# Patient Record
Sex: Male | Born: 1955 | Race: White | Hispanic: No | Marital: Married | Smoking: Former smoker
Health system: Southern US, Community
[De-identification: ages and names within clinical notes are randomized; demographics above are authoritative.]

## PROBLEM LIST (undated history)

## (undated) ENCOUNTER — Emergency Department (HOSPITAL_BASED_OUTPATIENT_CLINIC_OR_DEPARTMENT_OTHER): Payer: Medicaid Other | Source: Home / Self Care

## (undated) DIAGNOSIS — E7800 Pure hypercholesterolemia, unspecified: Secondary | ICD-10-CM

## (undated) DIAGNOSIS — F32A Depression, unspecified: Secondary | ICD-10-CM

## (undated) DIAGNOSIS — F329 Major depressive disorder, single episode, unspecified: Secondary | ICD-10-CM

## (undated) DIAGNOSIS — H9190 Unspecified hearing loss, unspecified ear: Secondary | ICD-10-CM

## (undated) DIAGNOSIS — M549 Dorsalgia, unspecified: Secondary | ICD-10-CM

## (undated) DIAGNOSIS — E119 Type 2 diabetes mellitus without complications: Secondary | ICD-10-CM

## (undated) DIAGNOSIS — M109 Gout, unspecified: Secondary | ICD-10-CM

## (undated) DIAGNOSIS — I739 Peripheral vascular disease, unspecified: Secondary | ICD-10-CM

## (undated) DIAGNOSIS — I10 Essential (primary) hypertension: Secondary | ICD-10-CM

## (undated) DIAGNOSIS — G4730 Sleep apnea, unspecified: Secondary | ICD-10-CM

## (undated) DIAGNOSIS — M1990 Unspecified osteoarthritis, unspecified site: Secondary | ICD-10-CM

## (undated) DIAGNOSIS — C801 Malignant (primary) neoplasm, unspecified: Secondary | ICD-10-CM

## (undated) HISTORY — PX: SKIN CANCER EXCISION: SHX779

## (undated) HISTORY — PX: ANKLE SURGERY: SHX546

---

## 2008-10-31 ENCOUNTER — Emergency Department (HOSPITAL_COMMUNITY): Admission: EM | Admit: 2008-10-31 | Discharge: 2008-10-31 | Payer: Self-pay | Admitting: Emergency Medicine

## 2008-10-31 ENCOUNTER — Encounter (INDEPENDENT_AMBULATORY_CARE_PROVIDER_SITE_OTHER): Payer: Self-pay | Admitting: Emergency Medicine

## 2008-10-31 ENCOUNTER — Ambulatory Visit: Payer: Self-pay | Admitting: Vascular Surgery

## 2008-11-09 ENCOUNTER — Ambulatory Visit: Payer: Self-pay | Admitting: Vascular Surgery

## 2008-12-02 ENCOUNTER — Ambulatory Visit (HOSPITAL_COMMUNITY): Admission: RE | Admit: 2008-12-02 | Discharge: 2008-12-02 | Payer: Self-pay | Admitting: Vascular Surgery

## 2008-12-02 ENCOUNTER — Ambulatory Visit: Payer: Self-pay | Admitting: Vascular Surgery

## 2010-11-06 LAB — POCT I-STAT, CHEM 8
BUN: 19 mg/dL (ref 6–23)
Calcium, Ion: 1.13 mmol/L (ref 1.12–1.32)
Chloride: 108 mEq/L (ref 96–112)
Creatinine, Ser: 0.8 mg/dL (ref 0.4–1.5)
Glucose, Bld: 136 mg/dL — ABNORMAL HIGH (ref 70–99)
Potassium: 3.9 mEq/L (ref 3.5–5.1)

## 2010-11-07 LAB — GLUCOSE, CAPILLARY: Glucose-Capillary: 84 mg/dL (ref 70–99)

## 2010-12-11 NOTE — Op Note (Signed)
NAME:  Darren Olson, Darren Olson                 ACCOUNT NO.:  [id number]   MEDICAL RECORD NO.:  [id number]          PATIENT TYPE:  AMB   LOCATION:  SDS                          FACILITY:  [facility]   PHYSICIAN:  Janetta Hora. Fields, MD  DATE OF BIRTH:  22-Sep-1955   DATE OF PROCEDURE:  12/02/2008  DATE OF DISCHARGE:  12/02/2008                               OPERATIVE REPORT   PROCEDURE:  Aortogram with bilateral lower extremity runoff.   PREOPERATIVE DIAGNOSIS:  Claudication right leg.   POSTOPERATIVE DIAGNOSIS:  Claudication right leg   ANESTHESIA:  Local.   OPERATIVE DETAILS:  After obtaining informed consent, the patient was  taken to the [facility] Lab.  The patient was placed in supine position on the  angio table.  Both groins were prepped and draped in the usual sterile  fashion.  Local anesthesia was infiltrated over the left common femoral  artery.  An introducer needle was used to cannulate the common femoral  artery and a 0.035 Wholey wire advanced into the abdominal aorta under  fluoroscopic guidance.  Next, a 5-French sheath was placed over the  guidewire in the left common femoral artery.  A 5-French pigtail  catheter was then placed over the guidewire in the abdominal aorta.  Abdominal aortogram was obtained.  This shows bilateral single renal  arteries which were patent.  There was mild atherosclerotic change of  the abdominal aorta but no focal stenosis.  The left and right common  iliac arteries are patent.  The left external and internal iliac  arteries are patent.  The right external and internal iliac arteries are  patent.  Oblique views were also obtained of the pelvis and again this  shows no flow-limiting stenosis in the common external or internal iliac  arteries bilaterally.  Next, lower extremity runoff views were obtained.  This shows the left and right common femoral arteries are patent  bilaterally.  The left profunda femoris and superficial femoral artery  is widely patent.   The right profunda femoris artery is patent.  There  is a 50% stenosis extending approximately 2 cm into the right  superficial femoral artery.  The right superficial femoral artery is  then patent throughout its course.  The right popliteal artery occludes  near its origin.  The left popliteal artery is patent.  In the left leg,  there is three-vessel runoff to the foot.  In the right leg, the  posterior tibial and peroneal arteries are occluded.  The anterior  tibial artery reconstitutes via geniculate and profunda collaterals.  The anterior tibial artery is then patent from the upper third of the  leg all the way into the foot.  Next, the pigtail catheter was removed  over a guidewire and 5-French sheath was left in place to pull it in the  holding area.  The patient tolerated the procedure well and there were  no complications.   OPERATIVE FINDINGS:  1. Occlusion of right popliteal artery.  2. Occlusion of right peroneal and posterior tibial arteries.  3. 50% stenosis of origin of right superficial femoral artery.  4.  One-vessel runoff to the right foot via the anterior tibial artery.  5. Patent left femoropopliteal and tibial arteries with three-vessel      runoff to the left foot.      Janetta Hora. Fields, MD  Electronically Signed     CEF/MEDQ  D:  12/02/2008  T:  12/03/2008  Job:  [id number]

## 2010-12-11 NOTE — Assessment & Plan Note (Signed)
OFFICE VISIT   Darren Olson, Darren Olson  DOB:  10/15/1955                                       11/09/2008  CHART#:[id number]   The patient is a 55 year old male with history of claudication primarily  in his right leg.  This has been occurring over the last year, becoming  progressively worse.  He now has symptoms in his right anterior calf  after approximately 30-40 yards.  His right leg has some chronic atrophy  and shortening secondary to a severe ankle fracture several years ago.  He has some mild symptoms in his left leg but this is really not  significant.  He denies rest pain.  He denies history of nonhealing  ulcerations.   His atherosclerotic risk factors include diabetes which he has had for  at least 3 years.  He also has a history of hypertension.  He is a  former smoker and quit in 2000.   PAST SURGICAL HISTORY:  He had a right ankle fusion.   PAST MEDICAL HISTORY:  Is otherwise fairly unremarkable.   MEDICATIONS:  1. Metformin 500 mg 1 tablet in the evening, 1 tablet in the morning.  2. Lisinopril 10 mg 1 in the morning.  3. Fish Oil 1000 mg 2 in the morning.  4. Acidophilus 2 tablets in the morning.  5. Cinnamon 2 tablets in the morning.  6. Vicodin p.r.n.   He has no known drug allergies.   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  Smoking history as listed above.  He does not  consume alcohol.   REVIEW OF SYSTEMS:  He is 5 feet 9 inches, 238 pounds.  ORTHOPEDIC:  He has multiple joint arthritis pain.  ENT:  He has some decline in his hearing.  CARDIAC, PULMONARY, GI, RENAL, NEUROLOGIC, PSYCHIATRIC, HEMATOLOGIC:  All negative.   PHYSICAL EXAM:  Blood pressure is 143/87 in the left arm, pulse is 76  and regular.  HEENT:  Unremarkable.  Neck:  Has 2+ carotid pulses  without bruit.  Chest:  Clear to auscultation.  Cardiac:  Regular rate  and rhythm without murmur.  Abdomen:  Obese, soft,  nontender,  nondistended, no masses.  Extremities:  He has 2+ radial pulses  bilaterally.  He has a 1+ right femoral pulse, he has a 2+ left femoral  pulse, he has absent popliteal and pedal pulses bilaterally.   He had an ABI performed of his right leg at [facility] on October 31, 2008.  This showed an ABI on the right side of 0.43.  Left ABI was within  normal limits at 1.2.   In summary, the patient has moderate to severe claudication symptoms in  his right lower extremity.  This right leg already has some disability  and this has been compounded by his claudication symptoms.  I discussed  with him today risk factor modification as far as controlling his  diabetes and hypertension.  I also started him on Pletal today 100 mg  twice a day.  He is also started on aspirin 81 mg once a day.  We will  schedule him for an aortogram, bilateral lower extremity runoff on November 25, 2008.  If he has significant iliac occlusive disease or superficial  femoral artery occlusive disease, we may consider  angioplasty and  stenting at that time.  The risks, benefits, possible complications,  procedure details were explained to the patient including, but not  limited to, renal insufficiency, renal dysfunction, bleeding, infection,  vessel injury.  He understands and agrees to proceed.   Janetta Hora. Fields, MD  Electronically Signed   CEF/MEDQ  D:  11/10/2008  T:  11/10/2008  Job:  [id number]   cc:   Dr. Aneta Mins

## 2013-09-20 ENCOUNTER — Encounter (HOSPITAL_BASED_OUTPATIENT_CLINIC_OR_DEPARTMENT_OTHER): Payer: Self-pay | Admitting: Emergency Medicine

## 2013-09-20 ENCOUNTER — Emergency Department (HOSPITAL_BASED_OUTPATIENT_CLINIC_OR_DEPARTMENT_OTHER)
Admission: EM | Admit: 2013-09-20 | Discharge: 2013-09-20 | Disposition: A | Discharge status: Home / Self Care | Payer: Medicaid Other | Attending: Emergency Medicine | Admitting: Emergency Medicine

## 2013-09-20 ENCOUNTER — Emergency Department (HOSPITAL_BASED_OUTPATIENT_CLINIC_OR_DEPARTMENT_OTHER): Payer: Medicaid Other

## 2013-09-20 DIAGNOSIS — F329 Major depressive disorder, single episode, unspecified: Secondary | ICD-10-CM | POA: Insufficient documentation

## 2013-09-20 DIAGNOSIS — I10 Essential (primary) hypertension: Secondary | ICD-10-CM | POA: Insufficient documentation

## 2013-09-20 DIAGNOSIS — F3289 Other specified depressive episodes: Secondary | ICD-10-CM | POA: Insufficient documentation

## 2013-09-20 DIAGNOSIS — M62838 Other muscle spasm: Secondary | ICD-10-CM | POA: Insufficient documentation

## 2013-09-20 DIAGNOSIS — Z79899 Other long term (current) drug therapy: Secondary | ICD-10-CM | POA: Insufficient documentation

## 2013-09-20 DIAGNOSIS — Z8669 Personal history of other diseases of the nervous system and sense organs: Secondary | ICD-10-CM | POA: Insufficient documentation

## 2013-09-20 DIAGNOSIS — Z87891 Personal history of nicotine dependence: Secondary | ICD-10-CM | POA: Insufficient documentation

## 2013-09-20 DIAGNOSIS — IMO0002 Reserved for concepts with insufficient information to code with codable children: Secondary | ICD-10-CM | POA: Insufficient documentation

## 2013-09-20 DIAGNOSIS — E119 Type 2 diabetes mellitus without complications: Secondary | ICD-10-CM | POA: Insufficient documentation

## 2013-09-20 DIAGNOSIS — E7800 Pure hypercholesterolemia, unspecified: Secondary | ICD-10-CM | POA: Insufficient documentation

## 2013-09-20 HISTORY — DX: Major depressive disorder, single episode, unspecified: F32.9

## 2013-09-20 HISTORY — DX: Sleep apnea, unspecified: G47.30

## 2013-09-20 HISTORY — DX: Dorsalgia, unspecified: M54.9

## 2013-09-20 HISTORY — DX: Pure hypercholesterolemia, unspecified: E78.00

## 2013-09-20 HISTORY — DX: Depression, unspecified: F32.A

## 2013-09-20 HISTORY — DX: Type 2 diabetes mellitus without complications: E11.9

## 2013-09-20 HISTORY — DX: Essential (primary) hypertension: I10

## 2013-09-20 HISTORY — DX: Unspecified hearing loss, unspecified ear: H91.90

## 2013-09-20 MED ORDER — METHOCARBAMOL 500 MG PO TABS: 500.0000 mg | ORAL_TABLET | Freq: Two times a day (BID) | ORAL | Status: DC

## 2013-09-20 MED ORDER — METHOCARBAMOL 500 MG PO TABS
1000.0000 mg | ORAL_TABLET | Freq: Once | ORAL | Status: AC
  Administered 2013-09-20: 1000 mg via ORAL
  Filled 2013-09-20: qty 2

## 2013-09-20 MED ORDER — NAPROXEN 250 MG PO TABS
500.0000 mg | ORAL_TABLET | Freq: Once | ORAL | Status: AC
  Administered 2013-09-20: 500 mg via ORAL
  Filled 2013-09-20: qty 2

## 2013-09-20 MED ORDER — KETOROLAC TROMETHAMINE 60 MG/2ML IM SOLN: 60.0000 mg | Freq: Once | INTRAMUSCULAR | Status: DC

## 2013-09-20 MED ORDER — TRAMADOL HCL 50 MG PO TABS: 50.0000 mg | ORAL_TABLET | Freq: Four times a day (QID) | ORAL | Status: AC | PRN

## 2013-09-20 MED ORDER — MELOXICAM 7.5 MG PO TABS: 7.5000 mg | ORAL_TABLET | Freq: Every day | ORAL | Status: DC

## 2013-09-20 NOTE — ED Provider Notes (Signed)
CSN: [id number]     Arrival date & time 09/20/13  1945 History   This chart was scribed for Tomorrow Dehaas Alfonso Patten, MD by Adriana Reams, ED Scribe. This patient was seen in room MH08/MH08 and the patient's care was started at 2300.   First MD Initiated Contact with Patient 09/20/13 2300     Chief Complaint  Patient presents with  . Shoulder Pain      Patient is a 58 y.o. male presenting with shoulder pain. The history is provided by the patient. No language interpreter was used.  Shoulder Pain This is a new problem. The current episode started more than 1 week ago. The problem occurs constantly. The problem has been gradually worsening. Pertinent negatives include no chest pain, no abdominal pain, no headaches and no shortness of breath. The symptoms are aggravated by bending and exertion. Nothing relieves the symptoms. Treatments tried: ibuprofen. The treatment provided no relief.    Past Medical History  Diagnosis Date  . Back pain   . Depression   . Hypertension   . High cholesterol   . Diabetes mellitus without complication   . Sleep apnea   . Hearing loss    Past Surgical History  Procedure Laterality Date  . Ankle surgery     No family history on file. History  Substance Use Topics  . Smoking status: Former Research scientist (life sciences)  . Smokeless tobacco: Not on file  . Alcohol Use: Yes     Comment: "a few beers here and there"    Review of Systems  Respiratory: Negative for shortness of breath.   Cardiovascular: Negative for chest pain.  Gastrointestinal: Negative for abdominal pain.  Musculoskeletal: Positive for myalgias.  Neurological: Negative for weakness, numbness and headaches.  All other systems reviewed and are negative.       Allergies  Review of patient's allergies indicates no known allergies.  Home Medications   Current Outpatient Rx  Name  Route  Sig  Dispense  Refill  . allopurinol (ZYLOPRIM) 100 MG tablet   Oral   Take 100 mg by mouth daily.          Marland Kitchen atorvastatin (LIPITOR) 40 MG tablet   Oral   Take 40 mg by mouth daily.         . DULoxetine (CYMBALTA) 30 MG capsule   Oral   Take 30 mg by mouth daily.         Marland Kitchen gabapentin (NEURONTIN) 300 MG capsule   Oral   Take 300 mg by mouth 3 (three) times daily.         . hydrochlorothiazide (HYDRODIURIL) 25 MG tablet   Oral   Take 25 mg by mouth daily.         Marland Kitchen lisinopril (PRINIVIL,ZESTRIL) 20 MG tablet   Oral   Take 20 mg by mouth daily.         . ondansetron (ZOFRAN) 4 MG tablet   Oral   Take 4 mg by mouth every 8 (eight) hours as needed for nausea or vomiting.         . traZODone (DESYREL) 50 MG tablet   Oral   Take 50 mg by mouth at bedtime.          BP 170/101  Pulse 88  Temp(Src) 98.6 F (37 C) (Oral)  Resp 20  Ht 5\' 9"  (1.753 m)  Wt 225 lb (102.059 kg)  BMI 33.21 kg/m2  SpO2 98%  Physical Exam  Nursing note and vitals reviewed. Constitutional:  He is oriented to person, place, and time. He appears well-developed and well-nourished. No distress.  HENT:  Head: Normocephalic and atraumatic.  Mouth/Throat: Oropharynx is clear and moist.  Eyes: Conjunctivae and EOM are normal. Pupils are equal, round, and reactive to light.  Neck: Normal range of motion. Neck supple. No tracheal deviation present. No thyromegaly present.  Cardiovascular: Normal rate, regular rhythm and normal heart sounds.   Pulmonary/Chest: Effort normal and breath sounds normal. No respiratory distress. He has no wheezes. He has no rales.  Abdominal: Soft. Bowel sounds are normal. He exhibits no distension. There is no tenderness. There is no rebound.  Musculoskeletal: Normal range of motion. He exhibits no edema.  Spasm of trapezius on left.  Negative Neers test on the left LUE neurovascularly intact 5/5 intact radial pulse on the left   Lymphadenopathy:    He has no cervical adenopathy.  Neurological: He is alert and oriented to person, place, and time. He has normal reflexes.   Intact DTR. Intact biceps reflex  Skin: Skin is warm and dry.  Psychiatric: He is agitated.    ED Course  Procedures (including critical care time) DIAGNOSTIC STUDIES: Oxygen Saturation is 98% on RA, normal by my interpretation.    COORDINATION OF CARE: 11:04 PM Discussed treatment plan with pt at bedside and pt agreed to plan. Discussed imaging results. Will discharge home.    Labs Review Labs Reviewed - No data to display Imaging Review Dg Shoulder Left  09/20/2013   CLINICAL DATA:  Left shoulder pain for 1 week. This radiates down the neck in arm.  EXAM: LEFT SHOULDER - 2+ VIEW  COMPARISON:  DG SHOULDER 2+V*L* dated 06/22/2013  FINDINGS: Is stable spurring of the inferior margin of the glenoid. No Hill-Sachs impaction to suggest that this is a remote bony Bankart injury. Slightly hooked acromial undersurface morphology. No fracture or dislocation. Mild irregularity of the inferior scapula on the transscapular projection is probably incidental/artifactual.  IMPRESSION: 1. Mild spurring of the inferior glenoid ram.  No acute findings.   Electronically Signed   By: Sherryl Barters M.D.   On: 09/20/2013 20:55    EKG Interpretation   None       MDM   Final diagnoses:  None    Will d/c with pain medication and muscles relaxant if symptoms persist follow up with your orthopedic surgeon for care and additional management.  Patient verbalizes understanding and agrees to follow up I personally performed the services described in this documentation, which was scribed in my presence. The recorded information has been reviewed and is accurate.      Carlisle Beers, MD 09/21/13 [phone]

## 2013-09-20 NOTE — ED Notes (Signed)
Pt c/o left shoulder pain radiating to left neck into back of head x 1 week

## 2013-09-20 NOTE — ED Notes (Signed)
Pain in left shoulder radiating down to elbow and up into neck x1 week.   No known injury.  Hx of back and neck issues.

## 2013-09-20 NOTE — Discharge Instructions (Signed)
°  Muscle Cramps and Spasms °Muscle cramps and spasms occur when a muscle or muscles tighten and you have no control over this tightening (involuntary muscle contraction). They are a common problem and can develop in any muscle. The most common place is in the calf muscles of the leg. Both muscle cramps and muscle spasms are involuntary muscle contractions, but they also have differences:  °· Muscle cramps are sporadic and painful. They may last a few seconds to a quarter of an hour. Muscle cramps are often more forceful and last longer than muscle spasms. °· Muscle spasms may or may not be painful. They may also last just a few seconds or much longer. °CAUSES  °It is uncommon for cramps or spasms to be due to a serious underlying problem. In many cases, the cause of cramps or spasms is unknown. Some common causes are:  °· Overexertion.   °· Overuse from repetitive motions (doing the same thing over and over).   °· Remaining in a certain position for a long period of time.   °· Improper preparation, form, or technique while performing a sport or activity.   °· Dehydration.   °· Injury.   °· Side effects of some medicines.   °· Abnormally low levels of the salts and ions in your blood (electrolytes), especially potassium and calcium. This could happen if you are taking water pills (diuretics) or you are pregnant.   °Some underlying medical problems can make it more likely to develop cramps or spasms. These include, but are not limited to:  °· Diabetes.   °· Parkinson disease.   °· Hormone disorders, such as thyroid problems.   °· Alcohol abuse.   °· Diseases specific to muscles, joints, and bones.   °· Blood vessel disease where not enough blood is getting to the muscles.   °HOME CARE INSTRUCTIONS  °· Stay well hydrated. Drink enough water and fluids to keep your urine clear or pale yellow. °· It may be helpful to massage, stretch, and relax the affected muscle. °· For tight or tense muscles, use a warm towel, heating  pad, or hot shower water directed to the affected area. °· If you are sore or have pain after a cramp or spasm, applying ice to the affected area may relieve discomfort. °· Put ice in a plastic bag. °· Place a towel between your skin and the bag. °· Leave the ice on for 15-20 minutes, 03-04 times a day. °· Medicines used to treat a known cause of cramps or spasms may help reduce their frequency or severity. Only take over-the-counter or prescription medicines as directed by your caregiver. °SEEK MEDICAL CARE IF:  °Your cramps or spasms get more severe, more frequent, or do not improve over time.  °MAKE SURE YOU:  °· Understand these instructions. °· Will watch your condition. °· Will get help right away if you are not doing well or get worse. °Document Released: 01/04/2002 Document Revised: 11/09/2012 Document Reviewed: 07/01/2012 °[employer]® Patient Information ©2014 [employer]. ° ° °

## 2013-09-20 NOTE — ED Notes (Signed)
Pt did not come when called for xrays

## 2015-07-31 ENCOUNTER — Emergency Department (HOSPITAL_BASED_OUTPATIENT_CLINIC_OR_DEPARTMENT_OTHER): Payer: Medicare Other

## 2015-07-31 ENCOUNTER — Emergency Department (HOSPITAL_BASED_OUTPATIENT_CLINIC_OR_DEPARTMENT_OTHER)
Admission: EM | Admit: 2015-07-31 | Discharge: 2015-07-31 | Disposition: A | Discharge status: Home / Self Care | Payer: Medicare Other | Attending: Emergency Medicine | Admitting: Emergency Medicine

## 2015-07-31 ENCOUNTER — Encounter (HOSPITAL_BASED_OUTPATIENT_CLINIC_OR_DEPARTMENT_OTHER): Payer: Self-pay | Admitting: *Deleted

## 2015-07-31 DIAGNOSIS — R1013 Epigastric pain: Secondary | ICD-10-CM | POA: Diagnosis present

## 2015-07-31 DIAGNOSIS — K5732 Diverticulitis of large intestine without perforation or abscess without bleeding: Secondary | ICD-10-CM | POA: Diagnosis not present

## 2015-07-31 DIAGNOSIS — E119 Type 2 diabetes mellitus without complications: Secondary | ICD-10-CM | POA: Diagnosis not present

## 2015-07-31 DIAGNOSIS — K5792 Diverticulitis of intestine, part unspecified, without perforation or abscess without bleeding: Secondary | ICD-10-CM

## 2015-07-31 DIAGNOSIS — H9190 Unspecified hearing loss, unspecified ear: Secondary | ICD-10-CM | POA: Diagnosis not present

## 2015-07-31 DIAGNOSIS — I10 Essential (primary) hypertension: Secondary | ICD-10-CM | POA: Insufficient documentation

## 2015-07-31 DIAGNOSIS — F329 Major depressive disorder, single episode, unspecified: Secondary | ICD-10-CM | POA: Diagnosis not present

## 2015-07-31 DIAGNOSIS — E7800 Pure hypercholesterolemia, unspecified: Secondary | ICD-10-CM | POA: Insufficient documentation

## 2015-07-31 DIAGNOSIS — Z87891 Personal history of nicotine dependence: Secondary | ICD-10-CM | POA: Insufficient documentation

## 2015-07-31 DIAGNOSIS — Z79899 Other long term (current) drug therapy: Secondary | ICD-10-CM | POA: Insufficient documentation

## 2015-07-31 LAB — COMPREHENSIVE METABOLIC PANEL
ALBUMIN: 4.1 g/dL (ref 3.5–5.0)
ALT: 19 U/L (ref 17–63)
ANION GAP: 9 (ref 5–15)
AST: 21 U/L (ref 15–41)
Alkaline Phosphatase: 68 U/L (ref 38–126)
BILIRUBIN TOTAL: 0.4 mg/dL (ref 0.3–1.2)
BUN: 19 mg/dL (ref 6–20)
CHLORIDE: 101 mmol/L (ref 101–111)
CO2: 26 mmol/L (ref 22–32)
Calcium: 9.3 mg/dL (ref 8.9–10.3)
Creatinine, Ser: 0.95 mg/dL (ref 0.61–1.24)
GFR calc Af Amer: >60 mL/min (ref >60)
GFR calc non Af Amer: >60 mL/min (ref >60)
GLUCOSE: 253 mg/dL — AB (ref 65–99)
POTASSIUM: 3.7 mmol/L (ref 3.5–5.1)
SODIUM: 136 mmol/L (ref 135–145)
TOTAL PROTEIN: 6.6 g/dL (ref 6.5–8.1)

## 2015-07-31 LAB — URINE MICROSCOPIC-ADD ON

## 2015-07-31 LAB — CBC WITH DIFFERENTIAL/PLATELET
BAND NEUTROPHILS: 1 %
Basophils Absolute: 0 10*3/uL (ref 0.0–0.1)
Basophils Relative: 0 %
EOS ABS: 0.1 10*3/uL (ref 0.0–0.7)
Eosinophils Relative: 2 %
HEMATOCRIT: 39.3 % (ref 39.0–52.0)
Hemoglobin: 13.6 g/dL (ref 13.0–17.0)
LYMPHS ABS: 1.1 10*3/uL (ref 0.7–4.0)
Lymphocytes Relative: 18 %
MCH: 30 pg (ref 26.0–34.0)
MCHC: 34.6 g/dL (ref 30.0–36.0)
MCV: 86.8 fL (ref 78.0–100.0)
MONO ABS: 0.4 10*3/uL (ref 0.1–1.0)
MONOS PCT: 7 %
NEUTROS ABS: 4.5 10*3/uL (ref 1.7–7.7)
Neutrophils Relative %: 72 %
PLATELETS: 192 10*3/uL (ref 150–400)
RBC: 4.53 MIL/uL (ref 4.22–5.81)
RDW: 12.4 % (ref 11.5–15.5)
WBC: 6.1 10*3/uL (ref 4.0–10.5)

## 2015-07-31 LAB — LIPASE, BLOOD: Lipase: 33 U/L (ref 11–51)

## 2015-07-31 LAB — URINALYSIS, ROUTINE W REFLEX MICROSCOPIC
Bilirubin Urine: NEGATIVE
Hgb urine dipstick: NEGATIVE
KETONES UR: NEGATIVE mg/dL
LEUKOCYTES UA: NEGATIVE
NITRITE: NEGATIVE
PH: 5.5 (ref 5.0–8.0)
Protein, ur: NEGATIVE mg/dL
Specific Gravity, Urine: 1.019 (ref 1.005–1.030)

## 2015-07-31 LAB — TROPONIN I: Troponin I: <0.03 ng/mL (ref <0.031)

## 2015-07-31 MED ORDER — FAMOTIDINE 20 MG PO TABS: 20.0000 mg | ORAL_TABLET | Freq: Two times a day (BID) | ORAL | Status: AC

## 2015-07-31 MED ORDER — FAMOTIDINE IN NACL 20-0.9 MG/50ML-% IV SOLN
20.0000 mg | Freq: Once | INTRAVENOUS | Status: AC
  Administered 2015-07-31: 20 mg via INTRAVENOUS
  Filled 2015-07-31: qty 50

## 2015-07-31 MED ORDER — DICYCLOMINE HCL 10 MG PO CAPS: 20.0000 mg | ORAL_CAPSULE | Freq: Four times a day (QID) | ORAL | Status: AC | PRN

## 2015-07-31 MED ORDER — CIPROFLOXACIN HCL 500 MG PO TABS: 500.0000 mg | ORAL_TABLET | Freq: Two times a day (BID) | ORAL | Status: DC

## 2015-07-31 MED ORDER — HYDROCODONE-ACETAMINOPHEN 5-325 MG PO TABS: ORAL_TABLET | ORAL | Status: DC

## 2015-07-31 MED ORDER — IOHEXOL 300 MG/ML  SOLN
25.0000 mL | Freq: Once | INTRAMUSCULAR | Status: AC | PRN
Start: 2015-07-31 — End: 2015-07-31
  Administered 2015-07-31: 25 mL via ORAL

## 2015-07-31 MED ORDER — IOHEXOL 300 MG/ML  SOLN
100.0000 mL | Freq: Once | INTRAMUSCULAR | Status: AC | PRN
  Administered 2015-07-31: 100 mL via INTRAVENOUS

## 2015-07-31 MED ORDER — METRONIDAZOLE 500 MG PO TABS: 500.0000 mg | ORAL_TABLET | Freq: Two times a day (BID) | ORAL | Status: AC

## 2015-07-31 MED ORDER — KETOROLAC TROMETHAMINE 30 MG/ML IJ SOLN
15.0000 mg | Freq: Once | INTRAMUSCULAR | Status: AC
  Administered 2015-07-31: 15 mg via INTRAVENOUS
  Filled 2015-07-31: qty 1

## 2015-07-31 MED ORDER — SODIUM CHLORIDE 0.9 % IV BOLUS (SEPSIS)
1000.0000 mL | Freq: Once | INTRAVENOUS | Status: AC
  Administered 2015-07-31: 1000 mL via INTRAVENOUS

## 2015-07-31 MED ORDER — DICYCLOMINE HCL 10 MG PO CAPS
10.0000 mg | ORAL_CAPSULE | Freq: Once | ORAL | Status: AC
  Administered 2015-07-31: 10 mg via ORAL
  Filled 2015-07-31: qty 1

## 2015-07-31 MED ORDER — METRONIDAZOLE 500 MG PO TABS: 500.0000 mg | ORAL_TABLET | Freq: Two times a day (BID) | ORAL | Status: DC

## 2015-07-31 NOTE — ED Provider Notes (Signed)
4:45 PM: Care assumed from [facility], PA-C at shift change.  4 days epigastric pain with nausea.  Denies CP or SOB.  Well appearing, abdominal exam benign.  Labs unremarkable. Has received fluids and toradol for pain. Anticipate discharge pending CT abdomen/pelvis and cardiac workup. Suspect acid reflux, d/c home with pepcid and norco.  Follow up PCP this week.    Troponin <0.03.  EKG NSR.  CT abdomen/pelvis remarkable for acute diverticulitis of proximal sigmoid colon not associated with perforation or abscess.  Normal appendix.  Atherosclerosis of the abdominal aorta.  Degenerative disc disease.  VSS.  Patient appears non-toxic or ill-appearing.  Labs reassuring.  Will d/c home with cipro and flagyl as well.  Dicussed strict return precautions. Patient agrees and acknowledges the above plan for discharge.      Gloriann Loan, PA-C 07/31/15 [place], MD 08/01/15 2110

## 2015-07-31 NOTE — ED Provider Notes (Signed)
CSN: [id number]     Arrival date & time 07/31/15  1327 History   First MD Initiated Contact with Patient 07/31/15 1510     Chief Complaint  Patient presents with  . Abdominal Pain     (Consider location/radiation/quality/duration/timing/severity/associated sxs/prior Treatment) HPI   Blood pressure 124/76, pulse 70, temperature 98.3 F (36.8 C), temperature source Oral, resp. rate 18, height 5' 8.5" (1.74 m), weight 99.791 kg, SpO2 99 %.  Darren Olson is a 60 y.o. male non-insulin-dependent diabetic hypertension and high cholesterol complaining of 4 days of worsening epigastric abdominal pain, patient states he's nauseous but has not had vomiting, states he has to strain to have a bowel movement, he is taking a stool softener, this is not atypical for him to be constipated. He denies fever, chills, melena, hematochezia. Patient is very concerned he has a hernia, states his father died from an incarcerated hernia, father-in-law recently passed away and his 18 year old granddaughter was just diagnosed with ovarian cancer, she recently had surgery and is undergoing chemotherapy.  Past Medical History  Diagnosis Date  . Back pain   . Depression   . Hypertension   . High cholesterol   . Diabetes mellitus without complication ([place])   . Sleep apnea   . Hearing loss    Past Surgical History  Procedure Laterality Date  . Ankle surgery     No family history on file. Social History  Substance Use Topics  . Smoking status: Former Research scientist (life sciences)  . Smokeless tobacco: None  . Alcohol Use: Yes     Comment: "a few beers here and there"    Review of Systems    Allergies  Review of patient's allergies indicates no known allergies.  Home Medications   Prior to Admission medications   Medication Sig Start Date End Date Taking? Authorizing Provider  metFORMIN (GLUCOPHAGE) 1000 MG tablet Take 1,000 mg by mouth daily with breakfast.   Yes Historical Provider, MD  allopurinol (ZYLOPRIM) 100 MG  tablet Take 100 mg by mouth daily.    Historical Provider, MD  atorvastatin (LIPITOR) 40 MG tablet Take 40 mg by mouth daily.    Historical Provider, MD  dicyclomine (BENTYL) 10 MG capsule Take 2 capsules (20 mg total) by mouth 4 (four) times daily as needed for spasms. 07/31/15   Navika Hoopes, PA-C  DULoxetine (CYMBALTA) 30 MG capsule Take 30 mg by mouth daily.    Historical Provider, MD  famotidine (PEPCID) 20 MG tablet Take 1 tablet (20 mg total) by mouth 2 (two) times daily. 07/31/15   Zhi Geier, PA-C  gabapentin (NEURONTIN) 300 MG capsule Take 300 mg by mouth 3 (three) times daily.    Historical Provider, MD  hydrochlorothiazide (HYDRODIURIL) 25 MG tablet Take 25 mg by mouth daily.    Historical Provider, MD  HYDROcodone-acetaminophen (NORCO/VICODIN) 5-325 MG tablet Take 1-2 tablets by mouth every 6 hours as needed for pain and/or cough. 07/31/15   Sharnette Kitamura, PA-C  lisinopril (PRINIVIL,ZESTRIL) 20 MG tablet Take 20 mg by mouth daily.    Historical Provider, MD  meloxicam (MOBIC) 7.5 MG tablet Take 1 tablet (7.5 mg total) by mouth daily. 09/20/13   April Palumbo, MD  methocarbamol (ROBAXIN) 500 MG tablet Take 1 tablet (500 mg total) by mouth 2 (two) times daily. 09/20/13   April Palumbo, MD  ondansetron (ZOFRAN) 4 MG tablet Take 4 mg by mouth every 8 (eight) hours as needed for nausea or vomiting.    Historical Provider, MD  traMADol Veatrice Bourbon)  50 MG tablet Take 1 tablet (50 mg total) by mouth every 6 (six) hours as needed. 09/20/13   April Palumbo, MD  traZODone (DESYREL) 50 MG tablet Take 50 mg by mouth at bedtime.    Historical Provider, MD   BP 121/76 mmHg  Pulse 65  Temp(Src) 98.3 F (36.8 C) (Oral)  Resp 18  Ht 5' 8.5" (1.74 m)  Wt 99.791 kg  BMI 32.96 kg/m2  SpO2 96% Physical Exam  Constitutional: He is oriented to person, place, and time. He appears well-developed and well-nourished. No distress.  HENT:  Head: Normocephalic and atraumatic.  Mouth/Throat: Oropharynx is  clear and moist.  Eyes: Conjunctivae and EOM are normal. Pupils are equal, round, and reactive to light.  Neck: Normal range of motion.  Cardiovascular: Normal rate, regular rhythm and intact distal pulses.   Pulmonary/Chest: Effort normal and breath sounds normal. No stridor.  Abdominal: Soft. There is no tenderness.  Mild, diffuse tenderness to palpation along the bilateral upper quadrants and epigastrium, with no guarding or rebound.  Murphy sign negative, no tenderness to palpation over McBurney's point, Rovsings, Psoas and obturator all negative.   Musculoskeletal: Normal range of motion.  Neurological: He is alert and oriented to person, place, and time.  Skin: He is not diaphoretic.  Psychiatric: He has a normal mood and affect.  Nursing note and vitals reviewed.   ED Course  Procedures (including critical care time) Labs Review Labs Reviewed  COMPREHENSIVE METABOLIC PANEL - Abnormal; Notable for the following:    Glucose, Bld 253 (*)    All other components within normal limits  URINALYSIS, ROUTINE W REFLEX MICROSCOPIC (NOT AT [facility]) - Abnormal; Notable for the following:    Glucose, UA >1000 (*)    All other components within normal limits  URINE MICROSCOPIC-ADD ON - Abnormal; Notable for the following:    Squamous Epithelial / LPF 0-5 (*)    Bacteria, UA RARE (*)    All other components within normal limits  CBC WITH DIFFERENTIAL/PLATELET  LIPASE, BLOOD  TROPONIN I    Imaging Review Dg Abd Acute W/chest  07/31/2015  CLINICAL DATA:  Abdominal pain for 4 days. EXAM: DG ABDOMEN ACUTE W/ 1V CHEST COMPARISON:  09/27/2014 FINDINGS: The lungs are clear wiithout focal pneumonia, edema, pneumothorax or pleural effusion. The cardiopericardial silhouette is within normal limits for size. The visualized bony structures of the thorax are intact. Upright film shows no evidence for intraperitoneal free air. There is no evidence for gaseous bowel dilation to suggest obstruction.  Atherosclerotic calcification noted in the wall of the abdominal aorta and common iliac arteries. Visualized bony anatomy shows no worrisome lytic or sclerotic osseous abnormality. IMPRESSION: 1. No acute cardiopulmonary findings. 2. No evidence for bowel obstruction or perforation. Electronically Signed   By: Misty Stanley M.D.   On: 07/31/2015 14:48   I have personally reviewed and evaluated these images and lab results as part of my medical decision-making.   EKG Interpretation None      MDM   Final diagnoses:  Epigastric pain    Filed Vitals:   07/31/15 1334 07/31/15 1550  BP: 124/76 121/76  Pulse: 70 65  Temp: 98.3 F (36.8 C)   TempSrc: Oral   Resp: 18 18  Height: 5' 8.5" (1.74 m)   Weight: 99.791 kg   SpO2: 99% 96%    Medications  famotidine (PEPCID) IVPB 20 mg premix (not administered)  dicyclomine (BENTYL) capsule 10 mg (not administered)  sodium chloride 0.9 % bolus  1,000 mL (1,000 mLs Intravenous New Bag/Given 07/31/15 1556)  ketorolac (TORADOL) 30 MG/ML injection 15 mg (15 mg Intravenous Given 07/31/15 1624)    SAIVION KUO is 60 y.o. male presenting with epigastric abdominal pain with nausea and constipation. Abdominal exam is benign, patient is afebrile and well-appearing. I've advised patient that I do not think he has a hernia, I don't think CAT scan would be beneficial at this moment, I've advised him that if he would like to obtain a CAT scan to absolutely rule out pathology we can do that. Consider patient's history of father passing away from hernia and recent family traumas of loss of father in law and the cancer of his granddaughter will obtain CT at patient's request. Patient is driving home so pain medication choices in the ED are limited.  Blood work resulted and is reassuring, UA is also without abnormality. Repeat abdominal exam remains benign. Case signed out to PA Rose at shift change: Plan is to follow-up CT scan, EKG, chest x-ray and  troponin.  Evaluation does not show pathology that would require ongoing emergent intervention or inpatient treatment. Pt is hemodynamically stable and mentating appropriately. Discussed findings and plan with patient/guardian, who agrees with care plan. All questions answered. Return precautions discussed and outpatient follow up given.   New Prescriptions   DICYCLOMINE (BENTYL) 10 MG CAPSULE    Take 2 capsules (20 mg total) by mouth 4 (four) times daily as needed for spasms.   FAMOTIDINE (PEPCID) 20 MG TABLET    Take 1 tablet (20 mg total) by mouth 2 (two) times daily.   HYDROCODONE-ACETAMINOPHEN (NORCO/VICODIN) 5-325 MG TABLET    Take 1-2 tablets by mouth every 6 hours as needed for pain and/or cough.         Monico Blitz, PA-C 07/31/15 1657  Pattricia Boss, MD 07/31/15 2303

## 2015-07-31 NOTE — ED Notes (Signed)
Abdominal pain x 4 days. States he has to strain to have a BM and thinks he may have a hernia.

## 2015-07-31 NOTE — Discharge Instructions (Signed)
Take vicodin for breakthrough pain, do not drink alcohol, drive, care for children or do other critical tasks while taking vicodin.  Return to the emergency room for severely worsening abdominal pain, abdominal pain that localizes to a particular area (especially the right lower part of the belly), pain that persists past 8-10 hours, blood in stool or vomit, severe weakness, fainting, or fever.   Maintain hydration by drinking small amounts of clear fluids frequently, then soft diet, and then advance to a solid diet as tolerated. Avoid foods that are spicy, high in fat or dairy.  Please follow with your primary care doctor in the next 2 days for a check-up. They must obtain records for further management.   Do not hesitate to return to the Emergency Department for any new, worsening or concerning symptoms.     Diverticulitis Diverticulitis is when small pockets that have formed in your colon (large intestine) become infected or swollen. HOME CARE  Follow your doctor's instructions.  Follow a special diet if told by your doctor.  When you feel better, your doctor may tell you to change your diet. You may be told to eat a lot of fiber. Fruits and vegetables are good sources of fiber. Fiber makes it easier to poop (have bowel movements).  Take supplements or probiotics as told by your doctor.  Only take medicines as told by your doctor.  Keep all follow-up visits with your doctor. GET HELP IF:  Your pain does not get better.  You have a hard time eating food.  You are not pooping like normal. GET HELP RIGHT AWAY IF:  Your pain gets worse.  Your problems do not get better.  Your problems suddenly get worse.  You have a fever.  You keep throwing up (vomiting).  You have bloody or black, tarry poop (stool). MAKE SURE YOU:   Understand these instructions.  Will watch your condition.  Will get help right away if you are not doing well or get worse.   This information is  not intended to replace advice given to you by your health care provider. Make sure you discuss any questions you have with your health care provider.   Document Released: 01/01/2008 Document Revised: 07/20/2013 Document Reviewed: 06/09/2013 Elsevier Interactive Patient Education [employer].

## 2016-10-01 ENCOUNTER — Encounter (HOSPITAL_BASED_OUTPATIENT_CLINIC_OR_DEPARTMENT_OTHER): Payer: Self-pay | Admitting: Emergency Medicine

## 2016-10-01 ENCOUNTER — Emergency Department (HOSPITAL_BASED_OUTPATIENT_CLINIC_OR_DEPARTMENT_OTHER)
Admission: EM | Admit: 2016-10-01 | Discharge: 2016-10-01 | Disposition: A | Discharge status: Home / Self Care | Payer: Medicare Other | Attending: Emergency Medicine | Admitting: Emergency Medicine

## 2016-10-01 DIAGNOSIS — E119 Type 2 diabetes mellitus without complications: Secondary | ICD-10-CM | POA: Insufficient documentation

## 2016-10-01 DIAGNOSIS — Z79899 Other long term (current) drug therapy: Secondary | ICD-10-CM | POA: Diagnosis not present

## 2016-10-01 DIAGNOSIS — I10 Essential (primary) hypertension: Secondary | ICD-10-CM | POA: Diagnosis not present

## 2016-10-01 DIAGNOSIS — J029 Acute pharyngitis, unspecified: Secondary | ICD-10-CM | POA: Diagnosis not present

## 2016-10-01 DIAGNOSIS — Z7984 Long term (current) use of oral hypoglycemic drugs: Secondary | ICD-10-CM | POA: Diagnosis not present

## 2016-10-01 DIAGNOSIS — Z87891 Personal history of nicotine dependence: Secondary | ICD-10-CM | POA: Diagnosis not present

## 2016-10-01 LAB — RAPID STREP SCREEN (MED CTR MEBANE ONLY): Streptococcus, Group A Screen (Direct): NEGATIVE

## 2016-10-01 NOTE — Discharge Instructions (Signed)
Treatment: Take ibuprofen or Aleve as prescribed over-the-counter as needed for your throat pain. You can also alternate with Tylenol as prescribed over-the-counter. Use warm saltwater gargles or throat lozenges to help with your symptoms. You can also use over-the-counter Chloraseptic spray as prescribed.  Follow-up: Please follow-up in the primary care provider if your symptoms are not improving over the next week. Please return to emergency department if you develop any new or worsening symptoms including difficulty breathing, development of masses in your neck, or any other concerning symptoms.

## 2016-10-01 NOTE — ED Provider Notes (Signed)
[facility] Provider Note   CSN: [id number] Arrival date & time: 10/01/16  0955     History   Chief Complaint Chief Complaint  Patient presents with  . Sore Throat    HPI Darren Olson is a 61 y.o. male with history of diabetes, sleep apnea who presents with a 2 day history of sore throat. Patient states his pain is worse with swallowing. He is able to eat and drink. He denies any drooling. He denies any fevers, nasal congestion, ear pain. Patient reports very intermittent cough, but no significant coughing. No shortness of breath or chest pain, reflux, abdominal pain, nausea, vomiting. Patient has used Sudafed, throat lozenges with honey, warm salt water gargles without significant relief. No known sick contacts.  HPI  Past Medical History:  Diagnosis Date  . Back pain   . Depression   . Diabetes mellitus without complication ([place])   . Hearing loss   . High cholesterol   . Hypertension   . Sleep apnea     There are no active problems to display for this patient.   Past Surgical History:  Procedure Laterality Date  . ANKLE SURGERY         Home Medications    Prior to Admission medications   Medication Sig Start Date End Date Taking? Authorizing Provider  COLCHICINE PO Take by mouth as needed.   Yes Historical Provider, MD  glipiZIDE (GLUCOTROL) 5 MG tablet Take 5 mg by mouth daily before breakfast.   Yes Historical Provider, MD  Multiple Vitamin (MULTIVITAMIN) tablet Take 1 tablet by mouth daily.   Yes Historical Provider, MD  Omega-3 Fatty Acids (FISH OIL PO) Take by mouth.   Yes Historical Provider, MD  allopurinol (ZYLOPRIM) 100 MG tablet Take 100 mg by mouth daily.    Historical Provider, MD  atorvastatin (LIPITOR) 40 MG tablet Take 40 mg by mouth daily.    Historical Provider, MD  ciprofloxacin (CIPRO) 500 MG tablet Take 1 tablet (500 mg total) by mouth 2 (two) times daily. 07/31/15   Gloriann Loan, PA-C  dicyclomine (BENTYL) 10 MG capsule Take 2  capsules (20 mg total) by mouth 4 (four) times daily as needed for spasms. 07/31/15   Nicole Pisciotta, PA-C  DULoxetine (CYMBALTA) 30 MG capsule Take 30 mg by mouth daily.    Historical Provider, MD  famotidine (PEPCID) 20 MG tablet Take 1 tablet (20 mg total) by mouth 2 (two) times daily. 07/31/15   Nicole Pisciotta, PA-C  gabapentin (NEURONTIN) 300 MG capsule Take 300 mg by mouth 3 (three) times daily.    Historical Provider, MD  hydrochlorothiazide (HYDRODIURIL) 25 MG tablet Take 25 mg by mouth daily.    Historical Provider, MD  HYDROcodone-acetaminophen (NORCO/VICODIN) 5-325 MG tablet Take 1-2 tablets by mouth every 6 hours as needed for pain and/or cough. 07/31/15   Nicole Pisciotta, PA-C  lisinopril (PRINIVIL,ZESTRIL) 20 MG tablet Take 20 mg by mouth daily.    Historical Provider, MD  meloxicam (MOBIC) 7.5 MG tablet Take 1 tablet (7.5 mg total) by mouth daily. 09/20/13   April Palumbo, MD  metFORMIN (GLUCOPHAGE) 1000 MG tablet Take 500 mg by mouth 2 (two) times daily.     Historical Provider, MD  methocarbamol (ROBAXIN) 500 MG tablet Take 1 tablet (500 mg total) by mouth 2 (two) times daily. 09/20/13   April Palumbo, MD  metroNIDAZOLE (FLAGYL) 500 MG tablet Take 1 tablet (500 mg total) by mouth 2 (two) times daily. 07/31/15   Gloriann Loan,  PA-C  ondansetron (ZOFRAN) 4 MG tablet Take 4 mg by mouth every 8 (eight) hours as needed for nausea or vomiting.    Historical Provider, MD  traMADol (ULTRAM) 50 MG tablet Take 1 tablet (50 mg total) by mouth every 6 (six) hours as needed. 09/20/13   April Palumbo, MD  traZODone (DESYREL) 50 MG tablet Take 50 mg by mouth at bedtime.    Historical Provider, MD    Family History History reviewed. No pertinent family history.  Social History Social History  Substance Use Topics  . Smoking status: Former Research scientist (life sciences)  . Smokeless tobacco: Never Used  . Alcohol use Yes     Comment: "a few beers here and there"     Allergies   Patient has no known  allergies.   Review of Systems Review of Systems  Constitutional: Negative for chills and fever.  HENT: Positive for sore throat. Negative for ear pain and facial swelling.   Respiratory: Positive for cough (very intermittent). Negative for shortness of breath.   Cardiovascular: Negative for chest pain.  Gastrointestinal: Negative for abdominal pain, nausea and vomiting.  Genitourinary: Negative for dysuria.  Musculoskeletal: Negative for back pain.  Skin: Negative for rash and wound.  Neurological: Negative for headaches.  Psychiatric/Behavioral: The patient is not nervous/anxious.      Physical Exam Updated Vital Signs BP 143/82 (BP Location: Right Arm)   Pulse 74   Temp 98.3 F (36.8 C) (Oral)   Resp 22   Ht 5\' 7"  (1.702 m)   Wt 98.9 kg   SpO2 98%   BMI 34.14 kg/m   Physical Exam  Constitutional: He appears well-developed and well-nourished. No distress.  HENT:  Head: Normocephalic and atraumatic.  Mouth/Throat: No trismus in the jaw. Posterior oropharyngeal erythema present. No oropharyngeal exudate, posterior oropharyngeal edema or tonsillar abscesses.  Eyes: Conjunctivae are normal. Pupils are equal, round, and reactive to light. Right eye exhibits no discharge. Left eye exhibits no discharge. No scleral icterus.  Neck: Normal range of motion. Neck supple. No thyromegaly present.  Anterior cervical tenderness; no palpable lymph nodes  Cardiovascular: Normal rate, regular rhythm, normal heart sounds and intact distal pulses.  Exam reveals no gallop and no friction rub.   No murmur heard. Pulmonary/Chest: Effort normal and breath sounds normal. No stridor. No respiratory distress. He has no wheezes. He has no rales.  Abdominal: Soft. Bowel sounds are normal. He exhibits no distension. There is no tenderness. There is no rebound and no guarding.  Musculoskeletal: He exhibits no edema.  Lymphadenopathy:    He has no cervical adenopathy.  Neurological: He is alert.  Coordination normal.  Skin: Skin is warm and dry. No rash noted. He is not diaphoretic. No pallor.  Psychiatric: He has a normal mood and affect.  Nursing note and vitals reviewed.    ED Treatments / Results  Labs (all labs ordered are listed, but only abnormal results are displayed) Labs Reviewed  RAPID STREP SCREEN (NOT AT [facility])  CULTURE, GROUP A STREP [facility])    EKG  EKG Interpretation None       Radiology No results found.  Procedures Procedures (including critical care time)  Medications Ordered in ED Medications - No data to display   Initial Impression / Assessment and Plan / ED Course  I have reviewed the triage vital signs and the nursing notes.  Pertinent labs & imaging results that were available during my care of the patient were reviewed by me and considered in my  medical decision making (see chart for details).     Pt with negative strep. Diagnosis of viral pharyngitis. No abx indicated at this time. Discussed that results of strep culture are pending and patient will be informed if positive result and abx will be called in at that time. Discharge with symptomatic tx. No evidence of dehydration. Pt is tolerating secretions. Presentation not concerning for peritonsillar abscess or spread of infection to deep spaces of the throat; patent airway. Specific return precautions discussed. Recommended PCP follow up. Pt appears safe for discharge. Patient also evaluated by Dr. Canary Brim who guided the patient's management and agrees with plan.   Final Clinical Impressions(s) / ED Diagnoses   Final diagnoses:  Viral pharyngitis    New Prescriptions Discharge Medication List as of 10/01/2016 11:23 AM       Frederica Kuster, PA-C 10/01/16 [place], MD 10/01/16 1155

## 2016-10-01 NOTE — ED Triage Notes (Signed)
Pt c/o sore throat x 2d; painful to swallow

## 2016-10-03 LAB — CULTURE, GROUP A STREP (THRC)

## 2017-06-27 ENCOUNTER — Emergency Department (HOSPITAL_BASED_OUTPATIENT_CLINIC_OR_DEPARTMENT_OTHER)
Admission: EM | Admit: 2017-06-27 | Discharge: 2017-06-27 | Disposition: A | Discharge status: Home / Self Care | Payer: Medicare Other | Attending: Emergency Medicine | Admitting: Emergency Medicine

## 2017-06-27 ENCOUNTER — Encounter (HOSPITAL_BASED_OUTPATIENT_CLINIC_OR_DEPARTMENT_OTHER): Payer: Self-pay | Admitting: Emergency Medicine

## 2017-06-27 ENCOUNTER — Emergency Department (HOSPITAL_BASED_OUTPATIENT_CLINIC_OR_DEPARTMENT_OTHER): Payer: Medicare Other

## 2017-06-27 DIAGNOSIS — I10 Essential (primary) hypertension: Secondary | ICD-10-CM | POA: Diagnosis not present

## 2017-06-27 DIAGNOSIS — Z7984 Long term (current) use of oral hypoglycemic drugs: Secondary | ICD-10-CM | POA: Diagnosis not present

## 2017-06-27 DIAGNOSIS — Z79899 Other long term (current) drug therapy: Secondary | ICD-10-CM | POA: Insufficient documentation

## 2017-06-27 DIAGNOSIS — Z87891 Personal history of nicotine dependence: Secondary | ICD-10-CM | POA: Insufficient documentation

## 2017-06-27 DIAGNOSIS — E119 Type 2 diabetes mellitus without complications: Secondary | ICD-10-CM | POA: Insufficient documentation

## 2017-06-27 DIAGNOSIS — M25511 Pain in right shoulder: Secondary | ICD-10-CM | POA: Insufficient documentation

## 2017-06-27 HISTORY — DX: Unspecified osteoarthritis, unspecified site: M19.90

## 2017-06-27 HISTORY — DX: Peripheral vascular disease, unspecified: I73.9

## 2017-06-27 MED ORDER — HYDROCODONE-ACETAMINOPHEN 5-325 MG PO TABS: 1.0000 | ORAL_TABLET | Freq: Four times a day (QID) | ORAL | 0 refills | Status: AC | PRN

## 2017-06-27 MED FILL — HYDROCODON-APAP 5-325: 5-325 | 2 days supply | Qty: 10 | Fill #0

## 2017-06-27 NOTE — ED Notes (Signed)
Pt concerned about wait time. Ambulated to Restroom.

## 2017-06-27 NOTE — ED Notes (Signed)
Pt refused sling. C/o increased pain in the position required for sling application. PA informed.

## 2017-06-27 NOTE — ED Notes (Signed)
ED Provider at bedside. 

## 2017-06-27 NOTE — ED Triage Notes (Signed)
Right shoulder pain over one week,   Pt states it has gotten worse lately.  Pain increases with certain movements.  No known injuries.

## 2017-06-27 NOTE — ED Provider Notes (Signed)
[facility] EMERGENCY DEPARTMENT Provider Note   CSN: [id number] Arrival date & time: 06/27/17  0800     History   Chief Complaint Chief Complaint  Patient presents with  . Shoulder Pain    HPI Darren Olson is a 61 y.o. male with history of diabetes, hypertension, depression, back pain, sleep apnea who presents with a one-week history of worsening right shoulder pain.  It is worse with certain movements.  He describes it as a throbbing pain.  He has been taking 4 ibuprofen every 6 hours without any relief.  Patient reports he has history of pinched nerves in his neck, however this feels a little different.  He also reports being bit by a bug on his right upper arm 3 months ago, however this has progressively improved after antibiotic treatment.  He denies any chest pain, shortness of breath, fever, numbness or tingling.  HPI  Past Medical History:  Diagnosis Date  . Arthritis   . Back pain   . Depression   . Diabetes mellitus without complication ([place])   . Hearing loss   . High cholesterol   . Hypertension   . PVD (peripheral vascular disease) ([place])   . Sleep apnea     There are no active problems to display for this patient.   Past Surgical History:  Procedure Laterality Date  . ANKLE SURGERY         Home Medications    Prior to Admission medications   Medication Sig Start Date End Date Taking? Authorizing Provider  allopurinol (ZYLOPRIM) 100 MG tablet Take 100 mg by mouth daily.    [provider]  atorvastatin (LIPITOR) 40 MG tablet Take 40 mg by mouth daily.    [provider]  COLCHICINE PO Take by mouth as needed.    [provider]  dicyclomine (BENTYL) 10 MG capsule Take 2 capsules (20 mg total) by mouth 4 (four) times daily as needed for spasms. 07/31/15   Pisciotta, Elmyra Ricks, PA-C  DULoxetine (CYMBALTA) 30 MG capsule Take 30 mg by mouth daily.    [provider]  famotidine (PEPCID) 20 MG tablet Take 1 tablet  (20 mg total) by mouth 2 (two) times daily. 07/31/15   Pisciotta, Elmyra Ricks, PA-C  gabapentin (NEURONTIN) 300 MG capsule Take 300 mg by mouth 3 (three) times daily.    [provider]  glipiZIDE (GLUCOTROL) 5 MG tablet Take 5 mg by mouth daily before breakfast.    [provider]  hydrochlorothiazide (HYDRODIURIL) 25 MG tablet Take 25 mg by mouth daily.    [provider]  HYDROcodone-acetaminophen (NORCO/VICODIN) 5-325 MG tablet Take 1-2 tablets by mouth every 6 (six) hours as needed. 06/27/17   Lisaanne Lawrie, Bea Graff, PA-C  lisinopril (PRINIVIL,ZESTRIL) 20 MG tablet Take 20 mg by mouth daily.    [provider]  meloxicam (MOBIC) 7.5 MG tablet Take 1 tablet (7.5 mg total) by mouth daily. 09/20/13   Palumbo, April, MD  metFORMIN (GLUCOPHAGE) 1000 MG tablet Take 500 mg by mouth 2 (two) times daily.     [provider]  methocarbamol (ROBAXIN) 500 MG tablet Take 1 tablet (500 mg total) by mouth 2 (two) times daily. 09/20/13   Palumbo, April, MD  metroNIDAZOLE (FLAGYL) 500 MG tablet Take 1 tablet (500 mg total) by mouth 2 (two) times daily. 07/31/15   Gloriann Loan, PA-C  Multiple Vitamin (MULTIVITAMIN) tablet Take 1 tablet by mouth daily.    [provider]  Omega-3 Fatty Acids ([place]  OIL PO) Take by mouth.    [provider]  ondansetron (ZOFRAN) 4 MG tablet Take 4 mg by mouth every 8 (eight) hours as needed for nausea or vomiting.    [provider]  traMADol (ULTRAM) 50 MG tablet Take 1 tablet (50 mg total) by mouth every 6 (six) hours as needed. 09/20/13   Palumbo, April, MD  traZODone (DESYREL) 50 MG tablet Take 50 mg by mouth at bedtime.    [provider]    Family History No family history on file.  Social History Social History   Tobacco Use  . Smoking status: Former Research scientist (life sciences)  . Smokeless tobacco: Never Used  Substance Use Topics  . Alcohol use: Yes    Comment: "a few beers here and there"  . Drug use: No      Allergies   Patient has no known allergies.   Review of Systems Review of Systems  Constitutional: Negative for chills and fever.  HENT: Negative for facial swelling and sore throat.   Respiratory: Negative for shortness of breath.   Cardiovascular: Negative for chest pain.  Gastrointestinal: Negative for abdominal pain, nausea and vomiting.  Musculoskeletal: Positive for arthralgias (R shoulder), back pain (chronic) and neck pain (R).  Skin: Negative for rash and wound.  Neurological: Negative for numbness.  Psychiatric/Behavioral: The patient is not nervous/anxious.      Physical Exam Updated Vital Signs BP 119/66 (BP Location: Left Arm)   Pulse 60   Temp 98.4 F (36.9 C) (Oral)   Resp 18   Ht 5\' 8"  (1.727 m)   Wt 98.9 kg (218 lb)   SpO2 100%   BMI 33.15 kg/m   Physical Exam  Constitutional: He appears well-developed and well-nourished. No distress.  HENT:  Head: Normocephalic and atraumatic.  Mouth/Throat: Oropharynx is clear and moist. No oropharyngeal exudate.  Eyes: Conjunctivae are normal. Pupils are equal, round, and reactive to light. Right eye exhibits no discharge. Left eye exhibits no discharge. No scleral icterus.  Neck: Normal range of motion. Neck supple. No thyromegaly present.  Cardiovascular: Normal rate, regular rhythm, normal heart sounds and intact distal pulses. Exam reveals no gallop and no friction rub.  No murmur heard. Pulmonary/Chest: Effort normal and breath sounds normal. No stridor. No respiratory distress. He has no wheezes. He has no rales.  Abdominal: Soft. Bowel sounds are normal. He exhibits no distension. There is no tenderness. There is no rebound and no guarding.  Musculoskeletal: He exhibits no edema.       Arms: Tenderness to the anterior shoulder and over the deltoid, minimal pain with passive range of motion; 5/5 strength neck pain; radial pulses intact; sensation intact; positive empty can test  Lymphadenopathy:    He  has no cervical adenopathy.  Neurological: He is alert. Coordination normal.  Skin: Skin is warm and dry. No rash noted. He is not diaphoretic. No pallor.  Psychiatric: He has a normal mood and affect.  Nursing note and vitals reviewed.    ED Treatments / Results  Labs (all labs ordered are listed, but only abnormal results are displayed) Labs Reviewed - No data to display  EKG  EKG Interpretation None       Radiology Dg Shoulder Right  Result Date: 06/27/2017 CLINICAL DATA:  Worsening right shoulder pain over the last week. EXAM: RIGHT SHOULDER - 2+ VIEW COMPARISON:  None. 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 Cannot rule out small loose bodies in the inferior recess. Calcific density projected between the humeral  head and the acromion could be a sharp acromial spur, articular or bursal loose body, or tendon calcification. AC joint appears normal. No acute finding. IMPRESSION: Glenohumeral degenerative changes. Abnormal calcification in the subacromial region could be a spur, bursal calcification or loose body, or tendon calcification. Electronically Signed   By: Nelson Chimes M.D.   On: 06/27/2017 09:53    Procedures Procedures (including critical care time)  Medications Ordered in ED Medications - No data to display   Initial Impression / Assessment and Plan / ED Course  I have reviewed the triage vital signs and the nursing notes.  Pertinent labs & imaging results that were available during my care of the patient were reviewed by me and considered in my medical decision making (see chart for details).     Patient with right shoulder pain.  X-ray shows glenohumeral degenerative changes as well as abnormal calcification in the subacromial region that could be a spur, bursal calcification or loose body, or tendon calcification.  Will discharge home with supportive treatment including range of motion exercises and heat/ice.  Will discharge home with short  prescription of Norco considering no improvement with ibuprofen over max dose.  I reviewed the  narcotic database and found no discrepancies.  Patient given follow-up to orthopedics.  Return precautions discussed.  Patient understands and agrees with plan.  Patient vitals stable throughout ED course and discharged in satisfactory condition. I discussed patient case with Dr. Ashok Cordia who agrees with plan.   Final Clinical Impressions(s) / ED Diagnoses   Final diagnoses:  Acute pain of right shoulder    ED Discharge Orders        Ordered    HYDROcodone-acetaminophen (NORCO/VICODIN) 5-325 MG tablet  Every 6 hours PRN     06/27/17 [address] [place] 06/27/17 1036    Lajean Saver, MD 06/27/17 1227

## 2017-06-27 NOTE — Discharge Instructions (Signed)
Medications: Norco  Treatment: Take Norco every 6 hours as needed for severe pain.  You can alternate with ibuprofen as prescribed over-the-counter.  Use ice and heat alternating 20 minutes on, 20 minutes off at least 3-4 times daily.  Attempt the range of motion exercises as tolerated.  Do not drink alcohol, drive, operate machinery or participate in any other potentially dangerous activities while taking opiate pain medication as it may make you sleepy. Do not take this medication with any other sedating medications, either prescription or over-the-counter. If you were prescribed Percocet or Vicodin, do not take these with acetaminophen (Tylenol) as it is already contained within these medications and overdose of Tylenol is dangerous.   This medication is an opiate (or narcotic) pain medication and can be habit forming.  Use it as little as possible to achieve adequate pain control.  Do not use or use it with extreme caution if you have a history of opiate abuse or dependence. This medication is intended for your use only - do not give any to anyone else and keep it in a secure place where nobody else, especially children, have access to it. It will also cause or worsen constipation, so you may want to consider taking an over-the-counter stool softener while you are taking this medication.  Follow-up: Please see Dr. Barbaraann Barthel as soon as possible for further evaluation and treatment of your pain.  Please return to the emergency department if you develop any new or worsening symptoms.

## 2017-08-10 ENCOUNTER — Encounter (HOSPITAL_BASED_OUTPATIENT_CLINIC_OR_DEPARTMENT_OTHER): Payer: Self-pay | Admitting: *Deleted

## 2017-08-10 ENCOUNTER — Emergency Department (HOSPITAL_BASED_OUTPATIENT_CLINIC_OR_DEPARTMENT_OTHER)
Admission: EM | Admit: 2017-08-10 | Discharge: 2017-08-11 | Disposition: A | Discharge status: Home / Self Care | Payer: Medicare Other | Attending: Emergency Medicine | Admitting: Emergency Medicine

## 2017-08-10 ENCOUNTER — Other Ambulatory Visit: Payer: Self-pay

## 2017-08-10 ENCOUNTER — Emergency Department (HOSPITAL_BASED_OUTPATIENT_CLINIC_OR_DEPARTMENT_OTHER): Payer: Medicare Other

## 2017-08-10 DIAGNOSIS — Z79899 Other long term (current) drug therapy: Secondary | ICD-10-CM | POA: Diagnosis not present

## 2017-08-10 DIAGNOSIS — Z87891 Personal history of nicotine dependence: Secondary | ICD-10-CM | POA: Diagnosis not present

## 2017-08-10 DIAGNOSIS — I10 Essential (primary) hypertension: Secondary | ICD-10-CM | POA: Diagnosis not present

## 2017-08-10 DIAGNOSIS — R109 Unspecified abdominal pain: Secondary | ICD-10-CM | POA: Diagnosis not present

## 2017-08-10 DIAGNOSIS — E119 Type 2 diabetes mellitus without complications: Secondary | ICD-10-CM | POA: Diagnosis not present

## 2017-08-10 DIAGNOSIS — Z7984 Long term (current) use of oral hypoglycemic drugs: Secondary | ICD-10-CM | POA: Insufficient documentation

## 2017-08-10 HISTORY — DX: Gout, unspecified: M10.9

## 2017-08-10 LAB — URINALYSIS, ROUTINE W REFLEX MICROSCOPIC
Bilirubin Urine: NEGATIVE
Glucose, UA: >=500 mg/dL — AB
Ketones, ur: 15 mg/dL — AB
Leukocytes, UA: NEGATIVE
NITRITE: NEGATIVE
Protein, ur: NEGATIVE mg/dL
pH: 5.5 (ref 5.0–8.0)

## 2017-08-10 LAB — CBC WITH DIFFERENTIAL/PLATELET
BASOS ABS: 0 10*3/uL (ref 0.0–0.1)
Basophils Relative: 0 %
EOS ABS: 0.1 10*3/uL (ref 0.0–0.7)
EOS PCT: 2 %
HCT: 39.2 % (ref 39.0–52.0)
Hemoglobin: 14 g/dL (ref 13.0–17.0)
Lymphocytes Relative: 18 %
Lymphs Abs: 1.3 10*3/uL (ref 0.7–4.0)
MCH: 30.9 pg (ref 26.0–34.0)
MCHC: 35.7 g/dL (ref 30.0–36.0)
MCV: 86.5 fL (ref 78.0–100.0)
Monocytes Absolute: 0.4 10*3/uL (ref 0.1–1.0)
Monocytes Relative: 6 %
Neutro Abs: 5.1 10*3/uL (ref 1.7–7.7)
Neutrophils Relative %: 74 %
PLATELETS: 183 10*3/uL (ref 150–400)
RBC: 4.53 MIL/uL (ref 4.22–5.81)
RDW: 12.8 % (ref 11.5–15.5)
WBC: 6.9 10*3/uL (ref 4.0–10.5)

## 2017-08-10 LAB — COMPREHENSIVE METABOLIC PANEL
ALT: 24 U/L (ref 17–63)
AST: 23 U/L (ref 15–41)
Albumin: 4.1 g/dL (ref 3.5–5.0)
Alkaline Phosphatase: 83 U/L (ref 38–126)
Anion gap: 11 (ref 5–15)
BUN: 16 mg/dL (ref 6–20)
CHLORIDE: 101 mmol/L (ref 101–111)
CO2: 25 mmol/L (ref 22–32)
CREATININE: 1.04 mg/dL (ref 0.61–1.24)
Calcium: 9.7 mg/dL (ref 8.9–10.3)
GFR calc non Af Amer: >60 mL/min (ref >60)
Glucose, Bld: 245 mg/dL — ABNORMAL HIGH (ref 65–99)
POTASSIUM: 3.6 mmol/L (ref 3.5–5.1)
SODIUM: 137 mmol/L (ref 135–145)
Total Bilirubin: 0.3 mg/dL (ref 0.3–1.2)
Total Protein: 6.7 g/dL (ref 6.5–8.1)

## 2017-08-10 LAB — URINALYSIS, MICROSCOPIC (REFLEX)

## 2017-08-10 LAB — LIPASE, BLOOD: Lipase: 39 U/L (ref 11–51)

## 2017-08-10 MED ORDER — SODIUM CHLORIDE 0.9 % IV BOLUS (SEPSIS)
1000.0000 mL | Freq: Once | INTRAVENOUS | Status: AC
  Administered 2017-08-10: 1000 mL via INTRAVENOUS

## 2017-08-10 MED ORDER — KETOROLAC TROMETHAMINE 15 MG/ML IJ SOLN
15.0000 mg | Freq: Once | INTRAMUSCULAR | Status: AC
  Administered 2017-08-10: 15 mg via INTRAVENOUS
  Filled 2017-08-10: qty 1

## 2017-08-10 NOTE — ED Triage Notes (Signed)
Pt reports right side stabbing flank pain x 2 days. Denies falls or injury.

## 2017-08-10 NOTE — ED Provider Notes (Signed)
[facility] EMERGENCY DEPARTMENT Provider Note   CSN: [id number] Arrival date & time: 08/10/17  2126     History   Chief Complaint Chief Complaint  Patient presents with  . Back Pain    HPI Darren Olson is a 62 y.o. male.  HPI   Right sided flank pain last few days and over weekend Right flank pain, sometimes radiating to right upper quadrant Tried ice, ibuprofen, massaging and no help Burning and stabbing pain.  Certain movements make it worse Now pain but not as severe as it was earlier.  Had kidney stone before, had to have it "blasted"  No urinary symptoms No vomiting, has had some nausea No diarrhea, has had constipation, took some laxatives in last few days which helped   Past Medical History:  Diagnosis Date  . Arthritis   . Back pain   . Depression   . Diabetes mellitus without complication ([place])   . Gout   . Hearing loss   . High cholesterol   . Hypertension   . PVD (peripheral vascular disease) ([place])   . Sleep apnea     There are no active problems to display for this patient.   Past Surgical History:  Procedure Laterality Date  . ANKLE SURGERY         Home Medications    Prior to Admission medications   Medication Sig Start Date End Date Taking? Authorizing Provider  allopurinol (ZYLOPRIM) 100 MG tablet Take 100 mg by mouth daily.   Yes [provider]  atorvastatin (LIPITOR) 40 MG tablet Take 40 mg by mouth daily.   Yes [provider]  CINNAMON PO Take by mouth.   Yes [provider]  gabapentin (NEURONTIN) 300 MG capsule Take 300 mg by mouth 3 (three) times daily.   Yes [provider]  glipiZIDE (GLUCOTROL) 5 MG tablet Take 5 mg by mouth daily before breakfast.   Yes [provider]  meloxicam (MOBIC) 7.5 MG tablet Take 1 tablet (7.5 mg total) by mouth daily. 09/20/13  Yes Palumbo, April, MD  metFORMIN (GLUCOPHAGE) 1000 MG tablet Take 500 mg by mouth 2 (two) times daily.    Yes  [provider]  COLCHICINE PO Take by mouth as needed.    [provider]  dicyclomine (BENTYL) 10 MG capsule Take 2 capsules (20 mg total) by mouth 4 (four) times daily as needed for spasms. 07/31/15   Pisciotta, Elmyra Ricks, PA-C  DULoxetine (CYMBALTA) 30 MG capsule Take 30 mg by mouth daily.    [provider]  famotidine (PEPCID) 20 MG tablet Take 1 tablet (20 mg total) by mouth 2 (two) times daily. 07/31/15   Pisciotta, Elmyra Ricks, PA-C  hydrochlorothiazide (HYDRODIURIL) 25 MG tablet Take 25 mg by mouth daily.    [provider]  HYDROcodone-acetaminophen (NORCO/VICODIN) 5-325 MG tablet Take 1-2 tablets by mouth every 6 (six) hours as needed. 06/27/17   Law, Bea Graff, PA-C  lisinopril (PRINIVIL,ZESTRIL) 20 MG tablet Take 20 mg by mouth daily.    [provider]  methocarbamol (ROBAXIN) 500 MG tablet Take 2 tablets (1,000 mg total) by mouth every 6 (six) hours as needed for muscle spasms (back pain). 08/11/17   Gareth Morgan, MD  metroNIDAZOLE (FLAGYL) 500 MG tablet Take 1 tablet (500 mg total) by mouth 2 (two) times daily. 07/31/15   Gloriann Loan, PA-C  Multiple Vitamin (MULTIVITAMIN) tablet Take 1 tablet by mouth daily.    [provider]  Omega-3 Fatty Acids (  [place] OIL PO) Take by mouth.    [provider]  ondansetron (ZOFRAN) 4 MG tablet Take 4 mg by mouth every 8 (eight) hours as needed for nausea or vomiting.    [provider]  traMADol (ULTRAM) 50 MG tablet Take 1 tablet (50 mg total) by mouth every 6 (six) hours as needed. 09/20/13   Palumbo, April, MD  traZODone (DESYREL) 50 MG tablet Take 50 mg by mouth at bedtime.    [provider]    Family History No family history on file.  Social History Social History   Tobacco Use  . Smoking status: Former Research scientist (life sciences)  . Smokeless tobacco: Never Used  Substance Use Topics  . Alcohol use: Yes    Comment: "a few beers here and there"  . Drug use: No     Allergies     Patient has no known allergies.   Review of Systems Review of Systems  Constitutional: Negative for fever.  HENT: Negative for sore throat.   Eyes: Negative for visual disturbance.  Respiratory: Negative for shortness of breath.   Cardiovascular: Negative for chest pain.  Gastrointestinal: Positive for abdominal pain and constipation. Negative for diarrhea, nausea and vomiting.  Genitourinary: Positive for flank pain. Negative for difficulty urinating.  Musculoskeletal: Positive for back pain. Negative for neck stiffness.  Skin: Negative for rash.  Neurological: Negative for syncope and headaches.     Physical Exam Updated Vital Signs BP 137/73 (BP Location: Right Wrist)   Pulse (!) 55   Temp 97.8 F (36.6 C) (Oral)   Resp 18   SpO2 97%   Physical Exam  Constitutional: He is oriented to person, place, and time. He appears well-developed and well-nourished. No distress.  HENT:  Head: Normocephalic and atraumatic.  Eyes: Conjunctivae and EOM are normal.  Neck: Normal range of motion.  Cardiovascular: Normal rate, regular rhythm, normal heart sounds and intact distal pulses. Exam reveals no gallop and no friction rub.  No murmur heard. Pulmonary/Chest: Effort normal and breath sounds normal. No respiratory distress. He has no wheezes. He has no rales.  Abdominal: Soft. He exhibits no distension. There is no tenderness. There is CVA tenderness (right). There is no guarding.  +right CVA tenderness  Musculoskeletal: He exhibits no edema.  Neurological: He is alert and oriented to person, place, and time.  Skin: Skin is warm and dry. He is not diaphoretic.  Nursing note and vitals reviewed.    ED Treatments / Results  Labs (all labs ordered are listed, but only abnormal results are displayed) Labs Reviewed  URINALYSIS, ROUTINE W REFLEX MICROSCOPIC - Abnormal; Notable for the following components:      Result Value   Specific Gravity, Urine >1.030 (*)    Glucose, UA  >=500 (*)    Hgb urine dipstick TRACE (*)    Ketones, ur 15 (*)    All other components within normal limits  URINALYSIS, MICROSCOPIC (REFLEX) - Abnormal; Notable for the following components:   Bacteria, UA RARE (*)    Squamous Epithelial / LPF 0-5 (*)    All other components within normal limits  COMPREHENSIVE METABOLIC PANEL - Abnormal; Notable for the following components:   Glucose, Bld 245 (*)    All other components within normal limits  CBC WITH DIFFERENTIAL/PLATELET  LIPASE, BLOOD    EKG  EKG Interpretation None       Radiology Ct Renal Stone Study  Result Date: 08/10/2017 CLINICAL DATA:  Right-sided flank pain for several days. EXAM:  CT ABDOMEN AND PELVIS WITHOUT CONTRAST TECHNIQUE: Multidetector CT imaging of the abdomen and pelvis was performed following the standard protocol without IV contrast. COMPARISON:  CT scan 07/31/2015 FINDINGS: Lower chest: The lung bases are clear of acute process. No pleural effusion or pulmonary lesions. The heart is normal in size. No pericardial effusion. Coronary artery calcifications are noted. The distal esophagus and aorta are unremarkable. Hepatobiliary: No focal hepatic lesions or intrahepatic biliary dilatation. Calcified granulomas are noted. The gallbladder is contracted. No common bile duct dilatation. Pancreas: No mass, inflammation or ductal dilatation. Spleen: Normal size.  No focal lesions. Adrenals/Urinary Tract: The adrenal glands and kidneys are unremarkable. No renal, ureteral or bladder calculi or mass. Stomach/Bowel: The stomach, duodenum, small bowel and colon are grossly normal without oral contrast. No inflammatory changes, mass lesions or obstructive findings. The terminal ileum and appendix are normal. Moderate sigmoid diverticulosis without findings for acute diverticulitis. Moderate-sized duodenum diverticulum. Vascular/Lymphatic: Advanced atherosclerotic calcifications involving the aorta and iliac arteries. No  mesenteric or retroperitoneal mass or adenopathy. Small scattered lymph nodes are noted. Reproductive: The prostate gland and seminal vesicles are unremarkable. Other: No pelvic mass or adenopathy. No free pelvic fluid collections. No inguinal mass or hernia. No abdominal wall hernia. Musculoskeletal: No acute bony findings or worrisome bony lesions. Moderate degenerative changes involving the spine and hips. IMPRESSION: 1. No acute abdominal/pelvic findings, mass lesions or adenopathy. 2. No renal, ureteral or bladder calculi or mass. 3. Age advanced atherosclerotic calcifications involving the aorta, iliac arteries, branch vessels and coronary arteries. Electronically Signed   By: Marijo Sanes M.D.   On: 08/10/2017 22:58    Procedures Procedures (including critical care time)  Medications Ordered in ED Medications  sodium chloride 0.9 % bolus 1,000 mL (0 mLs Intravenous Stopped 08/11/17 0014)  ketorolac (TORADOL) 15 MG/ML injection 15 mg (15 mg Intravenous Given 08/10/17 2259)     Initial Impression / Assessment and Plan / ED Course  I have reviewed the triage vital signs and the nursing notes.  Pertinent labs & imaging results that were available during my care of the patient were reviewed by me and considered in my medical decision making (see chart for details).     63 year old male who presents with concern for right flank pain.  Labs showed no significant findings.  CT shows no sign of nephrolithiasis or other acute pathology.  No sign of aortic aneurysm, and doubt rupture AAA given duration of symptoms and CT.  Pt without hx of afib, low susp for mesenteric ischemia or renal infarction. No rash and given pain localized mostly to flank do not have high enough suspicion for shingles to warrant treatment.  Suspect most likely msk etiology of pain. Given rx for robaxin. Patient discharged in stable condition with understanding of reasons to return.   Final Clinical Impressions(s) / ED  Diagnoses   Final diagnoses:  Right flank pain    ED Discharge Orders        Ordered    methocarbamol (ROBAXIN) 500 MG tablet  Every 6 hours PRN     08/11/17 0019       Gareth Morgan, MD 08/11/17 2094

## 2017-08-11 MED ORDER — METHOCARBAMOL 500 MG PO TABS: 1000.0000 mg | ORAL_TABLET | Freq: Four times a day (QID) | ORAL | 0 refills | Status: AC | PRN

## 2017-08-11 NOTE — ED Notes (Signed)
ED Provider at bedside. 

## 2018-06-05 ENCOUNTER — Emergency Department (HOSPITAL_BASED_OUTPATIENT_CLINIC_OR_DEPARTMENT_OTHER): Payer: Medicare Other

## 2018-06-05 ENCOUNTER — Encounter (HOSPITAL_BASED_OUTPATIENT_CLINIC_OR_DEPARTMENT_OTHER): Payer: Self-pay | Admitting: *Deleted

## 2018-06-05 ENCOUNTER — Other Ambulatory Visit: Payer: Self-pay

## 2018-06-05 ENCOUNTER — Emergency Department (HOSPITAL_BASED_OUTPATIENT_CLINIC_OR_DEPARTMENT_OTHER)
Admission: EM | Admit: 2018-06-05 | Discharge: 2018-06-05 | Disposition: A | Discharge status: Home / Self Care | Payer: Medicare Other | Attending: Emergency Medicine | Admitting: Emergency Medicine

## 2018-06-05 DIAGNOSIS — I10 Essential (primary) hypertension: Secondary | ICD-10-CM | POA: Insufficient documentation

## 2018-06-05 DIAGNOSIS — Z7984 Long term (current) use of oral hypoglycemic drugs: Secondary | ICD-10-CM | POA: Diagnosis not present

## 2018-06-05 DIAGNOSIS — Z79899 Other long term (current) drug therapy: Secondary | ICD-10-CM | POA: Diagnosis not present

## 2018-06-05 DIAGNOSIS — E119 Type 2 diabetes mellitus without complications: Secondary | ICD-10-CM | POA: Insufficient documentation

## 2018-06-05 DIAGNOSIS — M25562 Pain in left knee: Secondary | ICD-10-CM | POA: Diagnosis present

## 2018-06-05 DIAGNOSIS — Z87891 Personal history of nicotine dependence: Secondary | ICD-10-CM | POA: Diagnosis not present

## 2018-06-05 DIAGNOSIS — M1712 Unilateral primary osteoarthritis, left knee: Secondary | ICD-10-CM | POA: Diagnosis not present

## 2018-06-05 HISTORY — DX: Unspecified hearing loss, unspecified ear: H91.90

## 2018-06-05 MED ORDER — DICLOFENAC SODIUM 1 % TD GEL: 2.0000 g | Freq: Four times a day (QID) | TRANSDERMAL | 0 refills | Status: AC

## 2018-06-05 MED FILL — DICLOFENAC SODIUM 1% GEL: 1 | 25 days supply | Qty: 100 | Fill #0

## 2018-06-05 NOTE — ED Triage Notes (Signed)
Pt c/o left knee pain w/o injury x 2 days

## 2018-06-05 NOTE — Discharge Instructions (Addendum)
You will need to return to the imaging department here at [facility] at 9:30 AM on 06/06/2018 for an ultrasound to rule out a DVT of your left leg. In the meantime, use the Voltaren gel to help with your joint pain. Return to ED for worsening symptoms, injuries or falls, numbness in arms or legs, increased swelling or redness of your joint.

## 2018-06-05 NOTE — ED Provider Notes (Signed)
[facility]ERG[facility] DEPARTMENT Provider Note   CSN: [id number] Arrival date & time: 06/05/18  1321     History   Chief Complaint Chief Complaint  Patient presents with  . Knee Pain    HPI Darren Olson is a 62 y.o. male with a past medical history of DM, PVD, HTN, who presents to ED for evaluation of 1 day history of L knee and calf pain. He describes the pain as "tightness" and worse with movement. Symptoms began yesterday without any specified inciting factor.  States that the only thing that he can think of that may have caused the pain was walking.  No history of similar symptoms in the past.  He took ibuprofen with no improvement in his symptoms.  Denies any injuries, falls, history of fracture, dislocations or procedures in the area.  Patient states that baseline, his right lower extremity shorter and smaller than his left lower extremity.  Is unsure if the size of the left lower extremity has increased.  Denies any shortness of breath, history of DVT, numbness in arms or legs.  HPI  Past Medical History:  Diagnosis Date  . Arthritis   . Back pain   . Depression   . Diabetes mellitus without complication ([place])   . Gout   . Hearing loss   . High cholesterol   . HOH (hard of hearing)   . Hypertension   . PVD (peripheral vascular disease) ([place])   . Sleep apnea     There are no active problems to display for this patient.   Past Surgical History:  Procedure Laterality Date  . ANKLE SURGERY          Home Medications    Prior to Admission medications   Medication Sig Start Date End Date Taking? Authorizing Provider  allopurinol (ZYLOPRIM) 100 MG tablet Take 100 mg by mouth daily.    [provider]  atorvastatin (LIPITOR) 40 MG tablet Take 40 mg by mouth daily.    [provider]  CINNAMON PO Take by mouth.    [provider]  COLCHICINE PO Take by mouth as needed.    [provider]  diclofenac sodium (VOLTAREN) 1  % GEL Apply 2 g topically 4 (four) times daily. 06/05/18   Rajni Holsworth, PA-C  dicyclomine (BENTYL) 10 MG capsule Take 2 capsules (20 mg total) by mouth 4 (four) times daily as needed for spasms. 07/31/15   Pisciotta, Elmyra Ricks, PA-C  DULoxetine (CYMBALTA) 30 MG capsule Take 30 mg by mouth daily.    [provider]  famotidine (PEPCID) 20 MG tablet Take 1 tablet (20 mg total) by mouth 2 (two) times daily. 07/31/15   Pisciotta, Elmyra Ricks, PA-C  gabapentin (NEURONTIN) 300 MG capsule Take 300 mg by mouth 3 (three) times daily.    [provider]  glipiZIDE (GLUCOTROL) 5 MG tablet Take 5 mg by mouth daily before breakfast.    [provider]  hydrochlorothiazide (HYDRODIURIL) 25 MG tablet Take 25 mg by mouth daily.    [provider]  HYDROcodone-acetaminophen (NORCO/VICODIN) 5-325 MG tablet Take 1-2 tablets by mouth every 6 (six) hours as needed. 06/27/17   Law, Bea Graff, PA-C  lisinopril (PRINIVIL,ZESTRIL) 20 MG tablet Take 20 mg by mouth daily.    [provider]  meloxicam (MOBIC) 7.5 MG tablet Take 1 tablet (7.5 mg total) by mouth daily. 09/20/13   Palumbo, April, MD  metFORMIN (GLUCOPHAGE) 1000 MG tablet Take 500 mg by mouth 2 (two)  times daily.     [provider]  methocarbamol (ROBAXIN) 500 MG tablet Take 2 tablets (1,000 mg total) by mouth every 6 (six) hours as needed for muscle spasms (back pain). 08/11/17   Gareth Morgan, MD  metroNIDAZOLE (FLAGYL) 500 MG tablet Take 1 tablet (500 mg total) by mouth 2 (two) times daily. 07/31/15   Gloriann Loan, PA-C  Multiple Vitamin (MULTIVITAMIN) tablet Take 1 tablet by mouth daily.    [provider]  Omega-3 Fatty Acids (FISH OIL PO) Take by mouth.    [provider]  ondansetron (ZOFRAN) 4 MG tablet Take 4 mg by mouth every 8 (eight) hours as needed for nausea or vomiting.    [provider]  traMADol (ULTRAM) 50 MG tablet Take 1 tablet (50 mg total) by mouth every 6 (six) hours as  needed. 09/20/13   Palumbo, April, MD  traZODone (DESYREL) 50 MG tablet Take 50 mg by mouth at bedtime.    [provider]    Family History History reviewed. No pertinent family history.  Social History Social History   Tobacco Use  . Smoking status: Former Research scientist (life sciences)  . Smokeless tobacco: Never Used  Substance Use Topics  . Alcohol use: Yes    Comment: "a few beers here and there"  . Drug use: No     Allergies   Patient has no known allergies.   Review of Systems Review of Systems  Constitutional: Negative for appetite change, chills and fever.  HENT: Negative for ear pain, rhinorrhea, sneezing and sore throat.   Eyes: Negative for photophobia and visual disturbance.  Respiratory: Negative for cough, chest tightness, shortness of breath and wheezing.   Cardiovascular: Negative for chest pain and palpitations.  Gastrointestinal: Negative for abdominal pain, blood in stool, constipation, diarrhea, nausea and vomiting.  Genitourinary: Negative for dysuria, hematuria and urgency.  Musculoskeletal: Positive for arthralgias and myalgias.  Skin: Negative for rash.  Neurological: Negative for dizziness, weakness and light-headedness.     Physical Exam Updated Vital Signs BP (!) 149/100   Pulse 82   Temp 98.3 F (36.8 C)   Resp 16   Ht 5\' 7"  (1.702 m)   Wt 96.6 kg   SpO2 97%   BMI 33.36 kg/m   Physical Exam  Constitutional: He appears well-developed and well-nourished. No distress.  HENT:  Head: Normocephalic and atraumatic.  Nose: Nose normal.  Eyes: Pupils are equal, round, and reactive to light. Conjunctivae and EOM are normal. Right eye exhibits no discharge. Left eye exhibits no discharge. No scleral icterus.  Neck: Normal range of motion. Neck supple.  Cardiovascular: Normal rate, regular rhythm, normal heart sounds and intact distal pulses. Exam reveals no gallop and no friction rub.  No murmur heard. Pulmonary/Chest: Effort normal and breath sounds  normal. No respiratory distress.  Abdominal: Soft. Bowel sounds are normal. He exhibits no distension. There is no tenderness. There is no guarding.  Musculoskeletal: Normal range of motion. He exhibits tenderness. He exhibits no edema.  Left knee and left calf tenderness.  Right lower extremity shortened, smaller.  No overlying skin changes, pitting edema.  Full active and passive range of motion of knee without difficulty. 2+ DP pulse palpated bilaterally. Strength 5/5 in BLE.   Neurological: He is alert. He exhibits normal muscle tone. Coordination normal.  No facial asymmetry noted.   Skin: Skin is warm and dry. Capillary refill takes 2 to 3 seconds. No rash noted.  Psychiatric: He has a normal mood and affect.  Nursing note and vitals reviewed.    ED Treatments / Results  Labs (all labs ordered are listed, but only abnormal results are displayed) Labs Reviewed - No data to display  EKG None  Radiology Dg Knee Complete 4 Views Left  Result Date: 06/05/2018 CLINICAL DATA:  Knee pain for 2 days with swelling. EXAM: LEFT KNEE - COMPLETE 4+ VIEW COMPARISON:  None. FINDINGS: Atherosclerotic vascular calcifications. Tricompartmental marginal spurring along with spurring of the distal quadriceps tendon. Upper normal amount of fluid in the knee joint. Spurring of the tibial spine. Mildly sclerotic lesion in the proximal tibial metaphysis is probably a calcified enchondroma. Mild medial compartmental articular space narrowing. IMPRESSION: 1. Mild-to-moderate osteoarthritis. 2. 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 3. No knee effusion or fractures identified. 4. If pain persists despite conservative therapy, MRI may be warranted for further characterization. 5. Atherosclerosis. Electronically Signed   By: Van Clines M.D.   On: 06/05/2018 14:41    Procedures Procedures (including critical care time)  Medications Ordered in ED Medications - No data to  display   Initial Impression / Assessment and Plan / ED Course  I have reviewed the triage vital signs and the nursing notes.  Pertinent labs & imaging results that were available during my care of the patient were reviewed by me and considered in my medical decision making (see chart for details).     62 year old male with a past medical history of diabetes, PVD, hypertension presents to ED for 1 day history of left knee and calf pain.  He cannot recall any inciting event that may have triggered the symptoms.  On exam patient's left lower extremity is larger than right lower extremity.  However, patient states that the size and length of his legs has deferred for the past 20 years since he was involved in an accident.  Denies any history of DVT, recent immobilization.  No changes to range of motion noted on exam of the left knee.  No overlying skin changes noted.  Calf tenderness noted.  2+ DP pulse noted bilaterally.  Patient is ambulatory here.  X-ray shows mild to moderate osteoarthritis, enchondroma.  Ultrasound scheduled for tomorrow morning to rule out DVT.  In the meantime, will treat with Voltaren gel for possible flareup of osteoarthritis. Of note, patient did mention that he has been having intermittent dizziness for the past several weeks.  He is unsure if this is related to increasing his dose of metformin from 1000 mg to 2000 mg.  He spoke to his doctor about this but they do not feel that it was related to the metformin.  However, they did state that he could switch to 1500 mg.  Patient has not yet done this.  I did offer to obtain lab work including checking CBG.  However, patient declined and states that he will be evaluated if his symptoms worsen.  Patient is hemodynamically stable, in NAD, and able to ambulate in the ED. Evaluation does not show pathology that would require ongoing emergent intervention or inpatient treatment. I explained the diagnosis to the patient. Pain has been  managed and has no complaints prior to discharge. Patient is comfortable with above plan and is stable for discharge at this time. All questions were answered prior to disposition. Strict return precautions for returning to the ED were discussed. Encouraged follow up with PCP.    Portions of this note were generated with [employer]. Dictation errors may occur despite best attempts at proofreading.  Final Clinical Impressions(s) / ED Diagnoses   Final diagnoses:  Osteoarthritis of left knee, unspecified osteoarthritis type    ED Discharge Orders         Ordered    US Venous Img Lower Unilateral Left     06/05/18 1522    diclofenac sodium (VOLTAREN) 1 % GEL  4 times daily     06/05/18 1527           Delia Heady, PA-C 06/05/18 1532    Sherwood Gambler, MD 06/06/18 [phone]

## 2018-06-06 ENCOUNTER — Ambulatory Visit (HOSPITAL_BASED_OUTPATIENT_CLINIC_OR_DEPARTMENT_OTHER)
Admission: RE | Admit: 2018-06-06 | Discharge: 2018-06-06 | Disposition: A | Discharge status: Home / Self Care | Payer: Medicare Other | Source: Ambulatory Visit | Attending: Emergency Medicine | Admitting: Emergency Medicine

## 2018-06-06 DIAGNOSIS — M25562 Pain in left knee: Secondary | ICD-10-CM | POA: Insufficient documentation

## 2019-09-18 ENCOUNTER — Emergency Department (HOSPITAL_BASED_OUTPATIENT_CLINIC_OR_DEPARTMENT_OTHER): Payer: Medicare Other

## 2019-09-18 ENCOUNTER — Encounter (HOSPITAL_BASED_OUTPATIENT_CLINIC_OR_DEPARTMENT_OTHER): Payer: Self-pay | Admitting: Emergency Medicine

## 2019-09-18 ENCOUNTER — Emergency Department (HOSPITAL_BASED_OUTPATIENT_CLINIC_OR_DEPARTMENT_OTHER)
Admission: EM | Admit: 2019-09-18 | Discharge: 2019-09-18 | Disposition: A | Discharge status: Home / Self Care | Payer: Medicare Other | Attending: Emergency Medicine | Admitting: Emergency Medicine

## 2019-09-18 ENCOUNTER — Other Ambulatory Visit: Payer: Self-pay

## 2019-09-18 DIAGNOSIS — Z87891 Personal history of nicotine dependence: Secondary | ICD-10-CM | POA: Insufficient documentation

## 2019-09-18 DIAGNOSIS — E782 Mixed hyperlipidemia: Secondary | ICD-10-CM | POA: Diagnosis not present

## 2019-09-18 DIAGNOSIS — G44209 Tension-type headache, unspecified, not intractable: Secondary | ICD-10-CM | POA: Insufficient documentation

## 2019-09-18 DIAGNOSIS — R519 Headache, unspecified: Secondary | ICD-10-CM | POA: Diagnosis present

## 2019-09-18 DIAGNOSIS — Z79899 Other long term (current) drug therapy: Secondary | ICD-10-CM | POA: Insufficient documentation

## 2019-09-18 DIAGNOSIS — E119 Type 2 diabetes mellitus without complications: Secondary | ICD-10-CM | POA: Diagnosis not present

## 2019-09-18 DIAGNOSIS — I10 Essential (primary) hypertension: Secondary | ICD-10-CM | POA: Diagnosis not present

## 2019-09-18 LAB — CBC
HCT: 44.2 % (ref 39.0–52.0)
Hemoglobin: 15.1 g/dL (ref 13.0–17.0)
MCH: 30.6 pg (ref 26.0–34.0)
MCHC: 34.2 g/dL (ref 30.0–36.0)
MCV: 89.5 fL (ref 80.0–100.0)
Platelets: 202 10*3/uL (ref 150–400)
RBC: 4.94 MIL/uL (ref 4.22–5.81)
RDW: 12.6 % (ref 11.5–15.5)
WBC: 4.4 10*3/uL (ref 4.0–10.5)
nRBC: 0 % (ref 0.0–0.2)

## 2019-09-18 LAB — BASIC METABOLIC PANEL
Anion gap: 14 (ref 5–15)
BUN: 17 mg/dL (ref 8–23)
CO2: 24 mmol/L (ref 22–32)
Calcium: 9.8 mg/dL (ref 8.9–10.3)
Chloride: 101 mmol/L (ref 98–111)
Creatinine, Ser: 0.92 mg/dL (ref 0.61–1.24)
GFR calc Af Amer: >60 mL/min (ref >60)
GFR calc non Af Amer: >60 mL/min (ref >60)
Glucose, Bld: 191 mg/dL — ABNORMAL HIGH (ref 70–99)
Potassium: 4.1 mmol/L (ref 3.5–5.1)
Sodium: 139 mmol/L (ref 135–145)

## 2019-09-18 MED ORDER — DIPHENHYDRAMINE HCL 50 MG/ML IJ SOLN
12.5000 mg | Freq: Once | INTRAMUSCULAR | Status: AC
  Administered 2019-09-18: 12.5 mg via INTRAVENOUS
  Filled 2019-09-18: qty 1

## 2019-09-18 MED ORDER — BUTALBITAL-APAP-CAFFEINE 50-325-40 MG PO TABS: 1.0000 | ORAL_TABLET | Freq: Four times a day (QID) | ORAL | 0 refills | Status: AC | PRN

## 2019-09-18 MED ORDER — PROCHLORPERAZINE EDISYLATE 10 MG/2ML IJ SOLN
10.0000 mg | Freq: Once | INTRAMUSCULAR | Status: AC
  Administered 2019-09-18: 10 mg via INTRAVENOUS
  Filled 2019-09-18: qty 2

## 2019-09-18 MED ORDER — ACETAMINOPHEN 325 MG PO TABS
650.0000 mg | ORAL_TABLET | Freq: Once | ORAL | Status: AC
  Administered 2019-09-18: 650 mg via ORAL
  Filled 2019-09-18: qty 2

## 2019-09-18 MED ORDER — IOHEXOL 350 MG/ML SOLN
100.0000 mL | Freq: Once | INTRAVENOUS | Status: AC | PRN
  Administered 2019-09-18: 100 mL via INTRAVENOUS

## 2019-09-18 NOTE — ED Provider Notes (Signed)
[facility] EMERGENCY DEPARTMENT Provider Note   CSN: [id number] Arrival date & time: 09/18/19  [id number]     History Chief Complaint  Patient presents with  . Headache    Darren Olson is a 64 y.o. male.  Presents to ER with new onset headache.  Headache started Wednesday night, worsened on Thursday.  Has been relatively constant since that time.  No alleviating or aggravating symptoms.  No associated numbness, weakness, vision changes, speech changes, gait changes.  No neck pain or neck stiffness.  Headache seems to be more on the left side, more on front side.  Dull achy, currently moderate in severity.  Not sudden onset.  Does not frequently get headaches. HPI     Past Medical History:  Diagnosis Date  . Arthritis   . Back pain   . Depression   . Diabetes mellitus without complication ([place])   . Gout   . Hearing loss   . High cholesterol   . HOH (hard of hearing)   . Hypertension   . PVD (peripheral vascular disease) ([place])   . Sleep apnea     There are no problems to display for this patient.   Past Surgical History:  Procedure Laterality Date  . ANKLE SURGERY         History reviewed. No pertinent family history.  Social History   Tobacco Use  . Smoking status: Former Research scientist (life sciences)  . Smokeless tobacco: Never Used  Substance Use Topics  . Alcohol use: Yes    Comment: "a few beers here and there"  . Drug use: No    Home Medications Prior to Admission medications   Medication Sig Start Date End Date Taking? Authorizing Provider  allopurinol (ZYLOPRIM) 100 MG tablet Take 100 mg by mouth daily.    [provider]  atorvastatin (LIPITOR) 40 MG tablet Take 40 mg by mouth daily.    [provider]  butalbital-acetaminophen-caffeine (FIORICET) [phone] MG tablet Take 1 tablet by mouth every 6 (six) hours as needed for headache. 09/18/19 09/17/20  Lucrezia Starch, MD  CINNAMON PO Take by mouth.    [provider]  COLCHICINE PO  Take by mouth as needed.    [provider]  diclofenac sodium (VOLTAREN) 1 % GEL Apply 2 g topically 4 (four) times daily. 06/05/18   Khatri, Hina, PA-C  dicyclomine (BENTYL) 10 MG capsule Take 2 capsules (20 mg total) by mouth 4 (four) times daily as needed for spasms. 07/31/15   Pisciotta, Elmyra Ricks, PA-C  DULoxetine (CYMBALTA) 30 MG capsule Take 30 mg by mouth daily.    [provider]  famotidine (PEPCID) 20 MG tablet Take 1 tablet (20 mg total) by mouth 2 (two) times daily. 07/31/15   Pisciotta, Elmyra Ricks, PA-C  gabapentin (NEURONTIN) 300 MG capsule Take 300 mg by mouth 3 (three) times daily.    [provider]  glipiZIDE (GLUCOTROL) 5 MG tablet Take 5 mg by mouth daily before breakfast.    [provider]  hydrochlorothiazide (HYDRODIURIL) 25 MG tablet Take 25 mg by mouth daily.    [provider]  HYDROcodone-acetaminophen (NORCO/VICODIN) 5-325 MG tablet Take 1-2 tablets by mouth every 6 (six) hours as needed. 06/27/17   Law, Bea Graff, PA-C  lisinopril (PRINIVIL,ZESTRIL) 20 MG tablet Take 20 mg by mouth daily.    [provider]  meloxicam (MOBIC) 7.5 MG tablet Take 1 tablet (7.5 mg total) by mouth daily. 09/20/13   Palumbo, April, MD  metFORMIN (GLUCOPHAGE) 1000  MG tablet Take 500 mg by mouth 2 (two) times daily.     [provider]  methocarbamol (ROBAXIN) 500 MG tablet Take 2 tablets (1,000 mg total) by mouth every 6 (six) hours as needed for muscle spasms (back pain). 08/11/17   Gareth Morgan, MD  metroNIDAZOLE (FLAGYL) 500 MG tablet Take 1 tablet (500 mg total) by mouth 2 (two) times daily. 07/31/15   Gloriann Loan, PA-C  Multiple Vitamin (MULTIVITAMIN) tablet Take 1 tablet by mouth daily.    [provider]  Omega-3 Fatty Acids (FISH OIL PO) Take by mouth.    [provider]  ondansetron (ZOFRAN) 4 MG tablet Take 4 mg by mouth every 8 (eight) hours as needed for nausea or vomiting.    [provider]    traMADol (ULTRAM) 50 MG tablet Take 1 tablet (50 mg total) by mouth every 6 (six) hours as needed. 09/20/13   Palumbo, April, MD  traZODone (DESYREL) 50 MG tablet Take 50 mg by mouth at bedtime.    [provider]    Allergies    Patient has no known allergies.  Review of Systems   Review of Systems  Constitutional: Negative for chills and fever.  HENT: Negative for ear pain and sore throat.   Eyes: Negative for pain and visual disturbance.  Respiratory: Negative for cough and shortness of breath.   Cardiovascular: Negative for chest pain and palpitations.  Gastrointestinal: Negative for abdominal pain and vomiting.  Genitourinary: Negative for dysuria and hematuria.  Musculoskeletal: Negative for arthralgias and back pain.  Skin: Negative for color change and rash.  Neurological: Positive for headaches. Negative for seizures and syncope.  All other systems reviewed and are negative.   Physical Exam Updated Vital Signs BP 136/85 (BP Location: Right Arm)   Pulse 73   Temp 98.4 F (36.9 C) (Oral)   Resp 18   SpO2 100%   Physical Exam Vitals and nursing note reviewed.  Constitutional:      Appearance: He is well-developed.  HENT:     Head: Normocephalic and atraumatic.  Eyes:     General: No visual field deficit.    Conjunctiva/sclera: Conjunctivae normal.  Cardiovascular:     Rate and Rhythm: Normal rate and regular rhythm.     Heart sounds: No murmur.  Pulmonary:     Effort: Pulmonary effort is normal. No respiratory distress.     Breath sounds: Normal breath sounds.  Abdominal:     Palpations: Abdomen is soft.     Tenderness: There is no abdominal tenderness.  Musculoskeletal:     Cervical back: Neck supple.  Skin:    General: Skin is warm and dry.  Neurological:     Mental Status: He is alert and oriented to person, place, and time.     GCS: GCS eye subscore is 4. GCS verbal subscore is 5. GCS motor subscore is 6.     Cranial Nerves: No cranial nerve  deficit, dysarthria or facial asymmetry.     Sensory: No sensory deficit.     Motor: No weakness.     Coordination: Coordination normal.     Gait: Gait normal.  Psychiatric:        Mood and Affect: Mood normal.        Behavior: Behavior normal.     ED Results / Procedures / Treatments   Labs (all labs ordered are listed, but only abnormal results are displayed) Labs Reviewed  BASIC METABOLIC PANEL - Abnormal; Notable for the  following components:      Result Value   Glucose, Bld 191 (*)    All other components within normal limits  CBC    EKG None  Radiology CT Angio Head W or Wo Contrast  Result Date: 09/18/2019 CLINICAL DATA:  Subarachnoid hemorrhage suspected. Headache for 3 days. EXAM: CT ANGIOGRAPHY HEAD TECHNIQUE: Multidetector CT imaging of the head was performed using the standard protocol during bolus administration of intravenous contrast. Multiplanar CT image reconstructions and MIPs were obtained to evaluate the vascular anatomy. CONTRAST:  164mL OMNIPAQUE IOHEXOL 350 MG/ML SOLN COMPARISON:  None. FINDINGS: CT HEAD Brain: No evidence of acute infarction, hemorrhage, hydrocephalus, extra-axial collection or mass lesion/mass effect. Vascular: No hyperdense vessel or unexpected calcification. Skull: Normal. Negative for fracture or focal lesion. Sinuses: Retention cyst along the roof of the left maxillary sinus Orbits: Negative CTA HEAD Anterior circulation: Atherosclerotic calcification along the bilateral carotid siphons. No branch occlusion, beading, or aneurysm. Hypoplastic left A1 segment. Posterior circulation: Strong left vertebral artery dominance. The vertebrobasilar arteries are small in the setting of bilateral fetal type PCA. Negative for aneurysm, beading, or flow limiting stenosis Venous sinuses: Negative Anatomic variants: As above IMPRESSION: 1. No emergent finding or explanation for headache. 2. Atherosclerosis without flow limiting stenosis of major vessels.  Electronically Signed   By: Monte Fantasia M.D.   On: 09/18/2019 11:13    Procedures Procedures (including critical care time)  Medications Ordered in ED Medications  diphenhydrAMINE (BENADRYL) injection 12.5 mg (12.5 mg Intravenous Given 09/18/19 0950)  prochlorperazine (COMPAZINE) injection 10 mg (10 mg Intravenous Given 09/18/19 0950)  acetaminophen (TYLENOL) tablet 650 mg (650 mg Oral Given 09/18/19 0950)  iohexol (OMNIPAQUE) 350 MG/ML injection 100 mL (100 mLs Intravenous Contrast Given 09/18/19 1038)    ED Course  I have reviewed the triage vital signs and the nursing notes.  Pertinent labs & imaging results that were available during my care of the patient were reviewed by me and considered in my medical decision making (see chart for details).    MDM Rules/Calculators/A&P                      64 year old male presents to ER with new onset headache.  Not sudden onset no neurologic symptoms, no associated neck pain or neck stiffness, no tenderness over temporal region.  Given patient infrequently gets headaches, pursued CT imaging to further evaluate, CT head was negative for subarachnoid hemorrhage, CTA was negative for any aneurysm.  Given these findings, very low suspicion for [facility] or other acute neurologic process.  Reviewed precautions, recommended follow-up with primary doctor, given short  Rx for Fioricet.    After the discussed management above, the patient was determined to be safe for discharge.  The patient was in agreement with this plan and all questions regarding their care were answered.  ED return precautions were discussed and the patient will return to the ED with any significant worsening of condition.   Final Clinical Impression(s) / ED Diagnoses Final diagnoses:  Nonintractable headache, unspecified chronicity pattern, unspecified headache type    Rx / DC Orders ED Discharge Orders         Ordered    butalbital-acetaminophen-caffeine (FIORICET) 50-325-40  MG tablet  Every 6 hours PRN     09/18/19 1126           Lucrezia Starch, MD 09/18/19 1455

## 2019-09-18 NOTE — Discharge Instructions (Signed)
Please return to ER if you develop worsening headache, neck pain, fever, numbness, weakness, vision changes or other new concerning symptom.  Recommend recheck with primary doctor next week.  For headache, recommend Tylenol and Motrin.  For breakthrough pain, recommend prescribed Fioricet.  No Fioricet can make you drowsy and cannot be taken while driving or operating heavy machinery.

## 2019-09-18 NOTE — ED Triage Notes (Signed)
Pt here with severe headache since Thursday on left side of head. No other symptoms present.

## 2020-03-30 IMAGING — CT CT ANGIO HEAD
3 of 9 series · 17 of 47 positions shown · IV contrast (Omnipaque)
Comparison: None.

CLINICAL DATA: Subarachnoid hemorrhage suspected. Headache for 3
days.

EXAM:
CT ANGIOGRAPHY HEAD
TECHNIQUE: Multidetector CT imaging of the head was performed using the
standard protocol during bolus administration of intravenous
contrast. Multiplanar CT image reconstructions and MIPs were
obtained to evaluate the vascular anatomy.
CONTRAST:  100mL OMNIPAQUE IOHEXOL 350 MG/ML SOLN

[Series 11: ax thin · axial · 0.47mm/px · z∈[-175,-37]mm · 11 of 171 slices shown]
[im 14/171  brain]
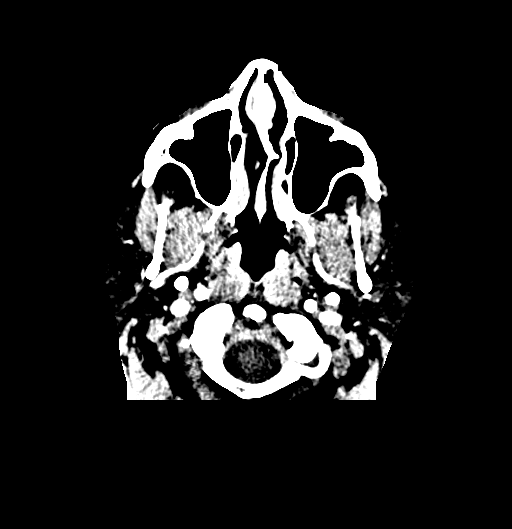
[im 27/171  bone]
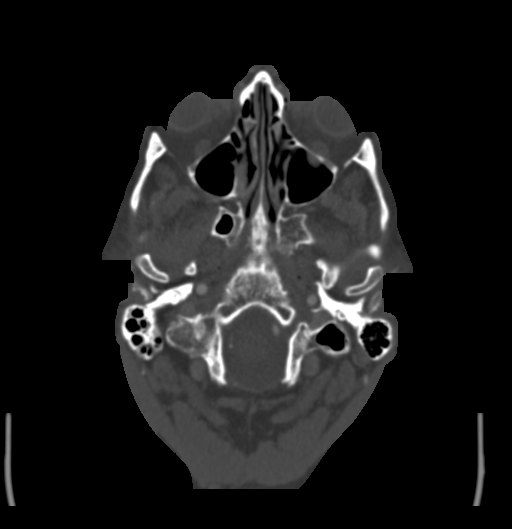
[im 40/171  brain]
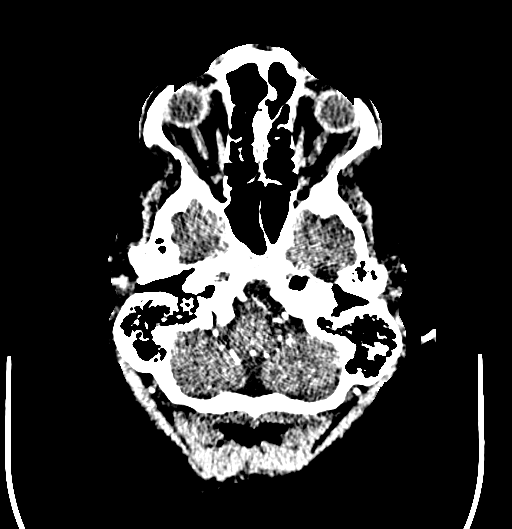
[im 53/171  bone]
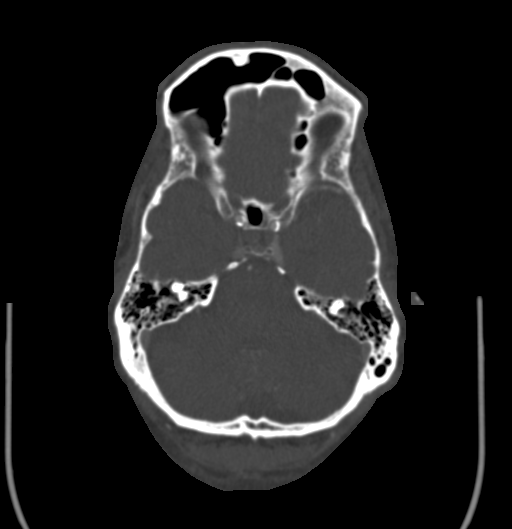
[im 66/171  brain]
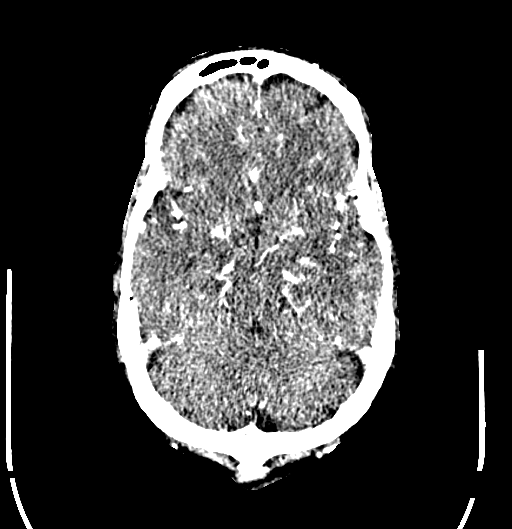
[im 92/171  bone]
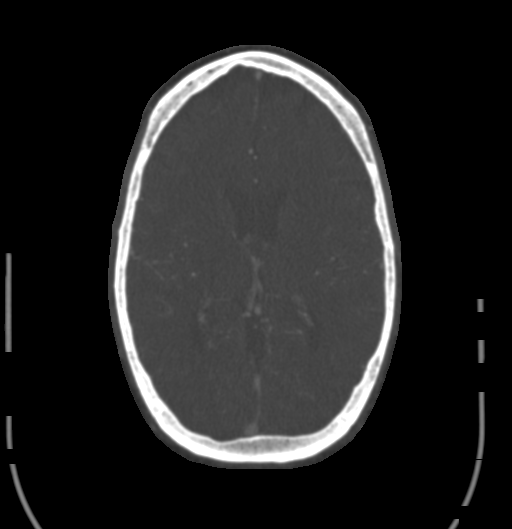
[im 105/171  brain]
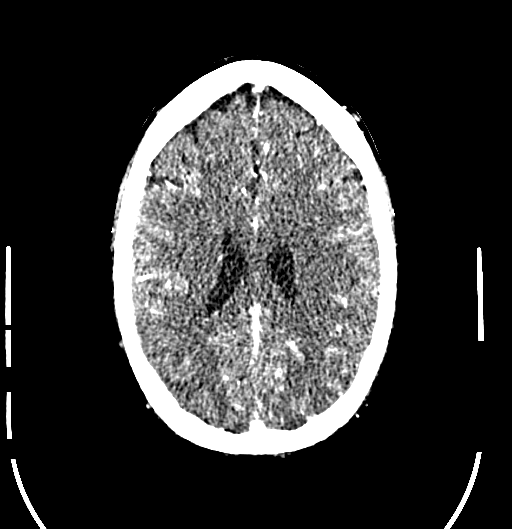
[im 118/171  bone]
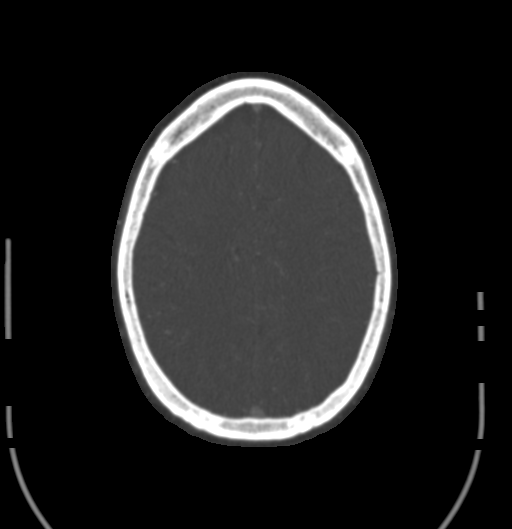
[im 131/171  brain]
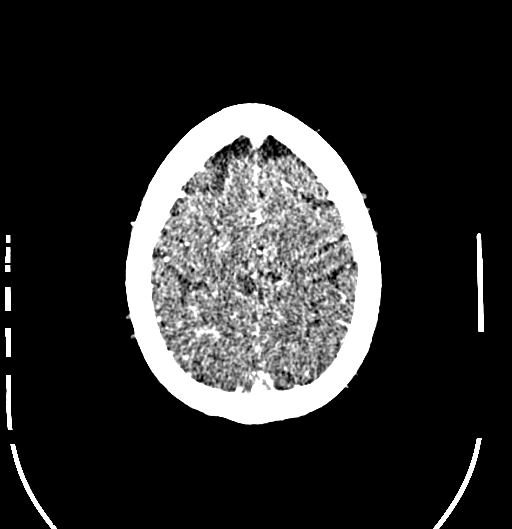
[im 144/171  bone]
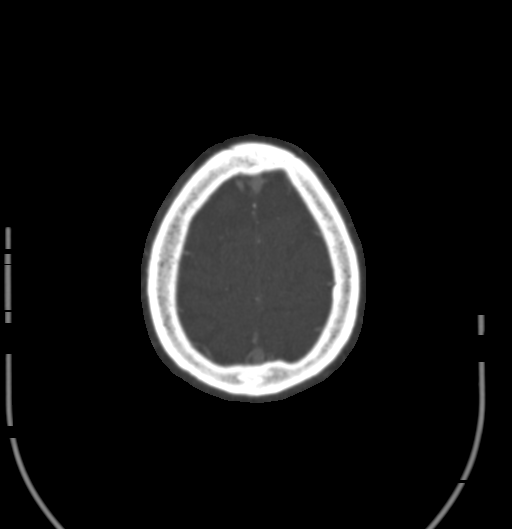
[im 157/171  brain]
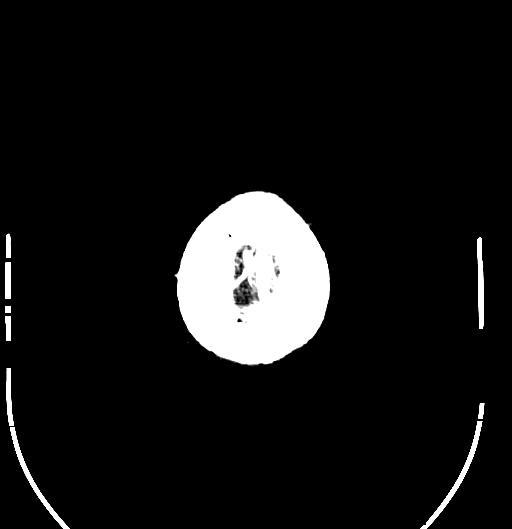

[Series 13: cor thin · coronal · 0.33mm/px · 3 of 213 slices shown]
[im 61/213  brain]
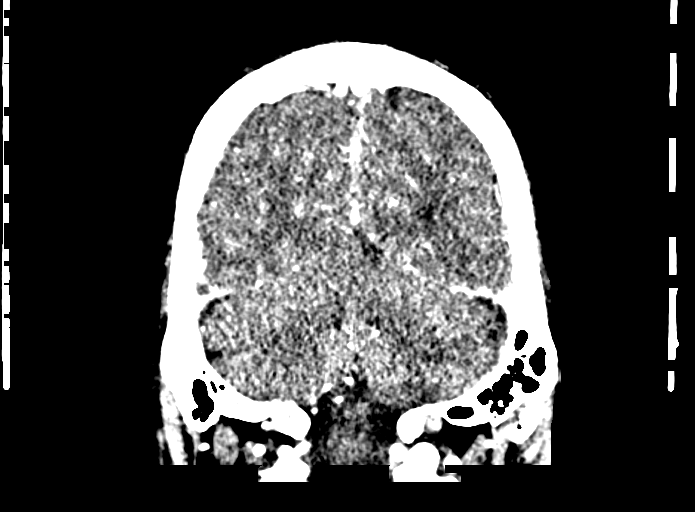
[im 91/213  brain]
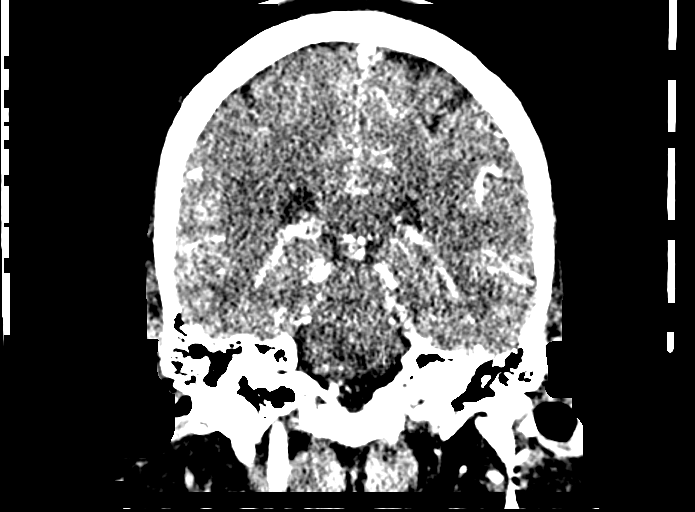
[im 122/213  brain]
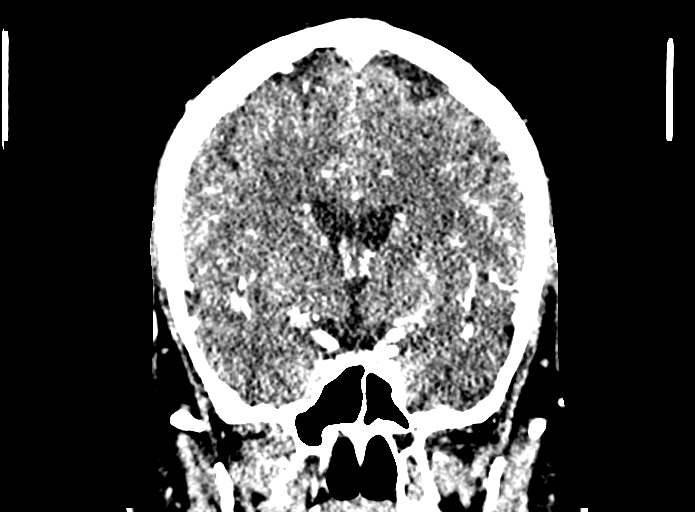

[Series 15: sag thin · sagittal · 0.36mm/px · 3 of 201 slices shown]
[im 41/201  brain]
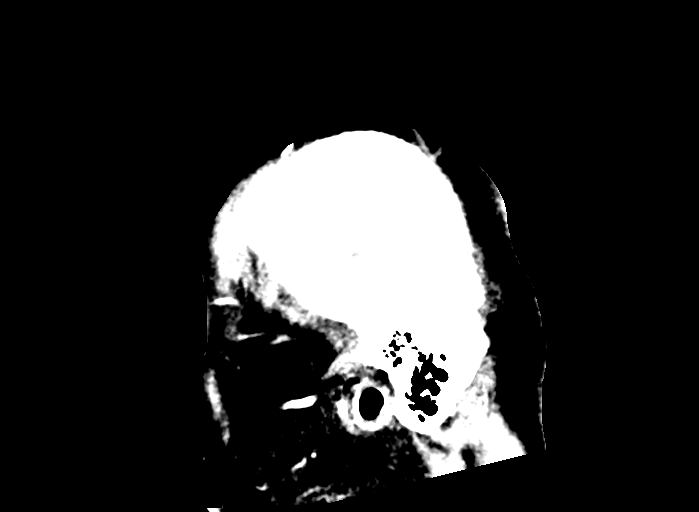
[im 81/201  brain]
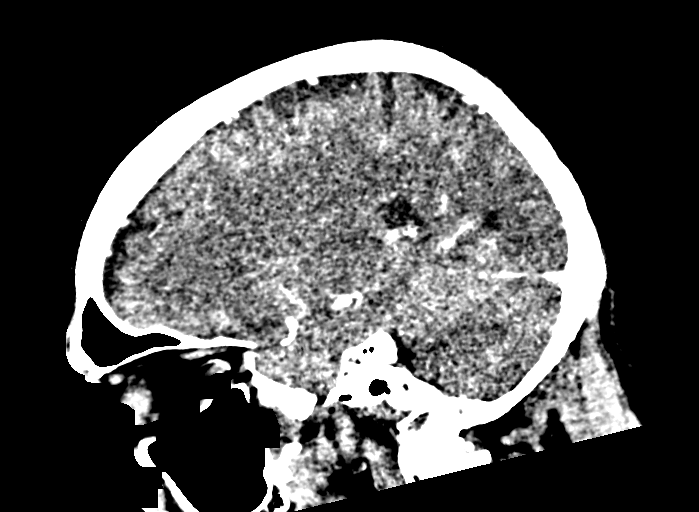
[im 121/201  brain]
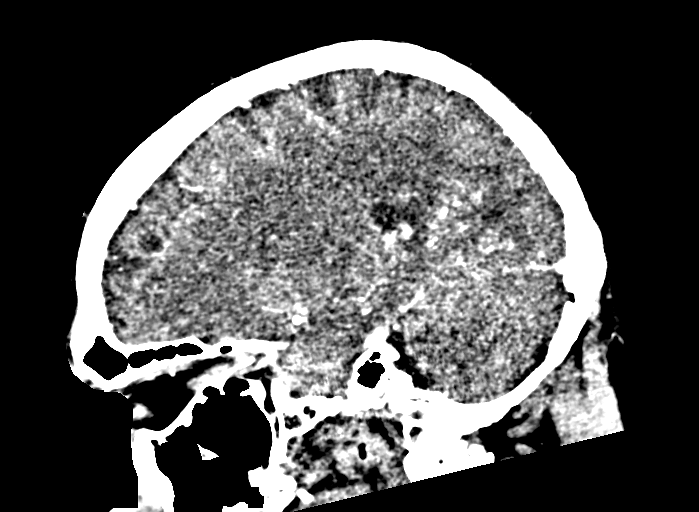

[17 of 47 positions shown; findings below may reference images not displayed]

FINDINGS: CT HEAD

Brain: No evidence of acute infarction, hemorrhage, hydrocephalus,
extra-axial collection or mass lesion/mass effect.

Vascular: No hyperdense vessel or unexpected calcification.

Skull: Normal. Negative for fracture or focal lesion.

Sinuses: Retention cyst along the roof of the left maxillary sinus

Orbits: Negative

CTA HEAD

Anterior circulation: Atherosclerotic calcification along the
bilateral carotid siphons. No branch occlusion, beading, or
aneurysm. Hypoplastic left A1 segment.

Posterior circulation: Strong left vertebral artery dominance. The
vertebrobasilar arteries are small in the setting of bilateral fetal
type PCA. Negative for aneurysm, beading, or flow limiting stenosis

Venous sinuses: Negative

Anatomic variants: As above
IMPRESSION: 1. No emergent finding or explanation for headache.
2. Atherosclerosis without flow limiting stenosis of major vessels.

## 2021-05-24 ENCOUNTER — Encounter (HOSPITAL_BASED_OUTPATIENT_CLINIC_OR_DEPARTMENT_OTHER): Payer: Self-pay

## 2021-05-24 ENCOUNTER — Emergency Department (HOSPITAL_BASED_OUTPATIENT_CLINIC_OR_DEPARTMENT_OTHER): Payer: Medicare Other

## 2021-05-24 ENCOUNTER — Other Ambulatory Visit: Payer: Self-pay

## 2021-05-24 ENCOUNTER — Emergency Department (HOSPITAL_BASED_OUTPATIENT_CLINIC_OR_DEPARTMENT_OTHER)
Admission: EM | Admit: 2021-05-24 | Discharge: 2021-05-24 | Disposition: A | Discharge status: Home / Self Care | Payer: Medicare Other | Attending: Student | Admitting: Student

## 2021-05-24 DIAGNOSIS — I10 Essential (primary) hypertension: Secondary | ICD-10-CM | POA: Insufficient documentation

## 2021-05-24 DIAGNOSIS — Z7984 Long term (current) use of oral hypoglycemic drugs: Secondary | ICD-10-CM | POA: Insufficient documentation

## 2021-05-24 DIAGNOSIS — Z859 Personal history of malignant neoplasm, unspecified: Secondary | ICD-10-CM | POA: Insufficient documentation

## 2021-05-24 DIAGNOSIS — M25511 Pain in right shoulder: Secondary | ICD-10-CM | POA: Diagnosis present

## 2021-05-24 DIAGNOSIS — Z87891 Personal history of nicotine dependence: Secondary | ICD-10-CM | POA: Insufficient documentation

## 2021-05-24 DIAGNOSIS — Z79899 Other long term (current) drug therapy: Secondary | ICD-10-CM | POA: Insufficient documentation

## 2021-05-24 DIAGNOSIS — M19011 Primary osteoarthritis, right shoulder: Secondary | ICD-10-CM | POA: Diagnosis not present

## 2021-05-24 DIAGNOSIS — E119 Type 2 diabetes mellitus without complications: Secondary | ICD-10-CM | POA: Diagnosis not present

## 2021-05-24 HISTORY — DX: Malignant (primary) neoplasm, unspecified: C80.1

## 2021-05-24 LAB — CBG MONITORING, ED: Glucose-Capillary: 94 mg/dL (ref 70–99)

## 2021-05-24 MED ORDER — KETOROLAC TROMETHAMINE 15 MG/ML IJ SOLN
15.0000 mg | Freq: Once | INTRAMUSCULAR | Status: AC
  Administered 2021-05-24: 15 mg via INTRAMUSCULAR
  Filled 2021-05-24: qty 1

## 2021-05-24 MED ORDER — NAPROXEN 500 MG PO TABS: 500.0000 mg | ORAL_TABLET | Freq: Two times a day (BID) | ORAL | 0 refills | Status: AC

## 2021-05-24 NOTE — ED Provider Notes (Signed)
[facility] EMERGENCY DEPARTMENT Provider Note   CSN: [id number] Arrival date & time: 05/24/21  0957     History Chief Complaint  Patient presents with   Arm Pain    Darren Olson is a 65 y.o. male who presents the emergency department for evaluation of right shoulder pain.  Patient states that yesterday he was attempting to start a gas powered blower and when pulling the starch during started to feel worsening shoulder pain.  He endorses shoulder pain that is a 6 out of 10, nonradiating but is worse with abduction and external rotation.  Pain is primarily at the level of the [facility] joint and into the back scapula.  He has not taken any pain medication prior to arrival today.  Denies numbness, tingling, weakness of the extremity.   Arm Pain Pertinent negatives include no chest pain, no abdominal pain and no shortness of breath.      Past Medical History:  Diagnosis Date   Arthritis    Back pain    Cancer ([place])    Depression    Diabetes mellitus without complication ([place])    Gout    Hearing loss    High cholesterol    HOH (hard of hearing)    Hypertension    PVD (peripheral vascular disease) ([place])    Sleep apnea     There are no problems to display for this patient.   Past Surgical History:  Procedure Laterality Date   ANKLE SURGERY     SKIN CANCER EXCISION         No family history on file.  Social History   Tobacco Use   Smoking status: Former   Smokeless tobacco: Never  Scientific laboratory technician Use: Never used  Substance Use Topics   Alcohol use: Yes    Comment: "a few beers here and there"   Drug use: No    Home Medications Prior to Admission medications   Medication Sig Start Date End Date Taking? Authorizing Provider  naproxen (NAPROSYN) 500 MG tablet Take 1 tablet (500 mg total) by mouth 2 (two) times daily. 05/24/21  Yes Jonell Brumbaugh, MD  allopurinol (ZYLOPRIM) 100 MG tablet Take 100 mg by mouth daily.    [provider]   atorvastatin (LIPITOR) 40 MG tablet Take 40 mg by mouth daily.    [provider]  CINNAMON PO Take by mouth.    [provider]  COLCHICINE PO Take by mouth as needed.    [provider]  diclofenac sodium (VOLTAREN) 1 % GEL Apply 2 g topically 4 (four) times daily. 06/05/18   Khatri, Hina, PA-C  dicyclomine (BENTYL) 10 MG capsule Take 2 capsules (20 mg total) by mouth 4 (four) times daily as needed for spasms. 07/31/15   Pisciotta, Elmyra Ricks, PA-C  DULoxetine (CYMBALTA) 30 MG capsule Take 30 mg by mouth daily.    [provider]  famotidine (PEPCID) 20 MG tablet Take 1 tablet (20 mg total) by mouth 2 (two) times daily. 07/31/15   Pisciotta, Elmyra Ricks, PA-C  gabapentin (NEURONTIN) 300 MG capsule Take 300 mg by mouth 3 (three) times daily.    [provider]  glipiZIDE (GLUCOTROL) 5 MG tablet Take 5 mg by mouth daily before breakfast.    [provider]  hydrochlorothiazide (HYDRODIURIL) 25 MG tablet Take 25 mg by mouth daily.    [provider]  HYDROcodone-acetaminophen (NORCO/VICODIN) 5-325 MG tablet Take 1-2 tablets by mouth every 6 (six) hours as needed.  06/27/17   Law, Bea Graff, PA-C  lisinopril (PRINIVIL,ZESTRIL) 20 MG tablet Take 20 mg by mouth daily.    [provider]  metFORMIN (GLUCOPHAGE) 1000 MG tablet Take 500 mg by mouth 2 (two) times daily.     [provider]  methocarbamol (ROBAXIN) 500 MG tablet Take 2 tablets (1,000 mg total) by mouth every 6 (six) hours as needed for muscle spasms (back pain). 08/11/17   Gareth Morgan, MD  metroNIDAZOLE (FLAGYL) 500 MG tablet Take 1 tablet (500 mg total) by mouth 2 (two) times daily. 07/31/15   Gloriann Loan, PA-C  Multiple Vitamin (MULTIVITAMIN) tablet Take 1 tablet by mouth daily.    [provider]  Omega-3 Fatty Acids (FISH OIL PO) Take by mouth.    [provider]  ondansetron (ZOFRAN) 4 MG tablet Take 4 mg by mouth every 8 (eight) hours as needed  for nausea or vomiting.    [provider]  traMADol (ULTRAM) 50 MG tablet Take 1 tablet (50 mg total) by mouth every 6 (six) hours as needed. 09/20/13   Palumbo, April, MD  traZODone (DESYREL) 50 MG tablet Take 50 mg by mouth at bedtime.    [provider]    Allergies    Patient has no known allergies.  Review of Systems   Review of Systems  Constitutional:  Negative for chills and fever.  HENT:  Negative for ear pain and sore throat.   Eyes:  Negative for pain and visual disturbance.  Respiratory:  Negative for cough and shortness of breath.   Cardiovascular:  Negative for chest pain and palpitations.  Gastrointestinal:  Negative for abdominal pain and vomiting.  Genitourinary:  Negative for dysuria and hematuria.  Musculoskeletal:  Negative for arthralgias and back pain.       Right shoulder pain  Skin:  Negative for color change and rash.  Neurological:  Negative for seizures and syncope.  All other systems reviewed and are negative.  Physical Exam Updated Vital Signs BP 126/71   Pulse 70   Temp 98.3 F (36.8 C) (Oral)   Resp 17   Ht 5\' 8"  (1.727 m)   Wt 92.5 kg   SpO2 98%   BMI 31.02 kg/m   Physical Exam Vitals and nursing note reviewed.  Constitutional:      Appearance: He is well-developed.  HENT:     Head: Normocephalic and atraumatic.  Eyes:     Conjunctiva/sclera: Conjunctivae normal.  Cardiovascular:     Rate and Rhythm: Normal rate and regular rhythm.     Heart sounds: No murmur heard. Pulmonary:     Effort: Pulmonary effort is normal. No respiratory distress.     Breath sounds: Normal breath sounds.  Abdominal:     Palpations: Abdomen is soft.     Tenderness: There is no abdominal tenderness.  Musculoskeletal:        General: Tenderness (r shoulder) present.     Cervical back: Neck supple.  Skin:    General: Skin is warm and dry.  Neurological:     Mental Status: He is alert.    ED Results / Procedures / Treatments    Labs (all labs ordered are listed, but only abnormal results are displayed) Labs Reviewed  CBG MONITORING, ED    EKG None  Radiology DG Shoulder Right  Result Date: 05/24/2021 CLINICAL DATA:  Right shoulder pain after injury pulling rope. EXAM: RIGHT SHOULDER - 2+ VIEW COMPARISON:  May 10, 2020. FINDINGS: There is no evidence  of fracture or dislocation. Mild degenerative changes seen involving the acromioclavicular joint. Mild acromial spurring is noted. Soft tissues are unremarkable. 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 Mild acromial spurring. No acute abnormality seen. Electronically Signed   By: Marijo Conception M.D.   On: 05/24/2021 10:44    Procedures Procedures   Medications Ordered in ED Medications  ketorolac (TORADOL) 15 MG/ML injection 15 mg (15 mg Intramuscular Given 05/24/21 1258)    ED Course  I have reviewed the triage vital signs and the nursing notes.  Pertinent labs & imaging results that were available during my care of the patient were reviewed by me and considered in my medical decision making (see chart for details).    MDM Rules/Calculators/A&P                           Patient seen the emergency department for evaluation of right shoulder pain.  Physical exam with tenderness at the [facility] joint but is otherwise unremarkable.  X-ray shows mild degenerative joint disease of the right AC joint with mild acromial spurring.  Patient given Toradol and placed in a sling for comfort.  He has an orthopedist that he sees for trigger finger on the right and will follow-up with this orthopedic provider.  Patient then discharged with a prescription for Naprosyn. Final Clinical Impression(s) / ED Diagnoses Final diagnoses:  Arthritis of right acromioclavicular joint    Rx / DC Orders ED Discharge Orders          Ordered    naproxen (NAPROSYN) 500 MG tablet  2 times daily        05/24/21 1325             Branko Steeves,  Nunda, MD 05/24/21 1606

## 2021-05-24 NOTE — ED Triage Notes (Signed)
Pt arrives with c/o pain to right arm since yesterday. Pt reports pulling to start a blower, no other injury. Pt reports increased pain when lifting arm with decreased ROM. Points to upper right arm and back of shoulder blade. Pt reports taking daily medications, nothing for pain.

## 2021-12-04 IMAGING — CR DG SHOULDER 2+V*R*
3 series · 3 of 3 positions shown · non-contrast
Comparison: May 10, 2020.

CLINICAL DATA: Right shoulder pain after injury pulling rope.

EXAM:
RIGHT SHOULDER - 2+ VIEW

[w shoulder grashey right]
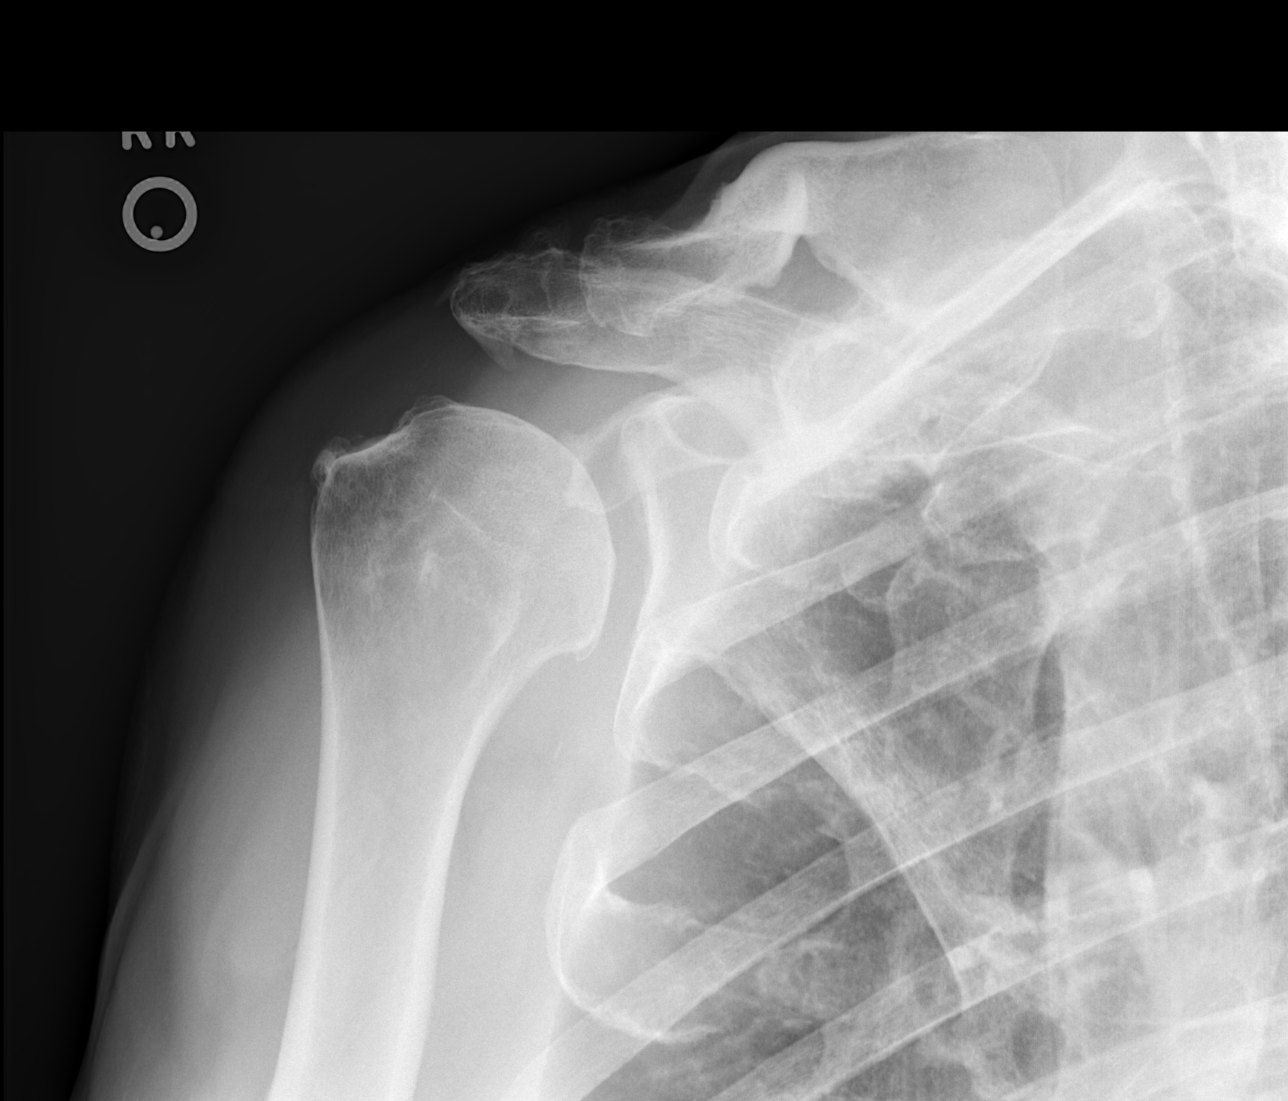

[w shoulder y view right]
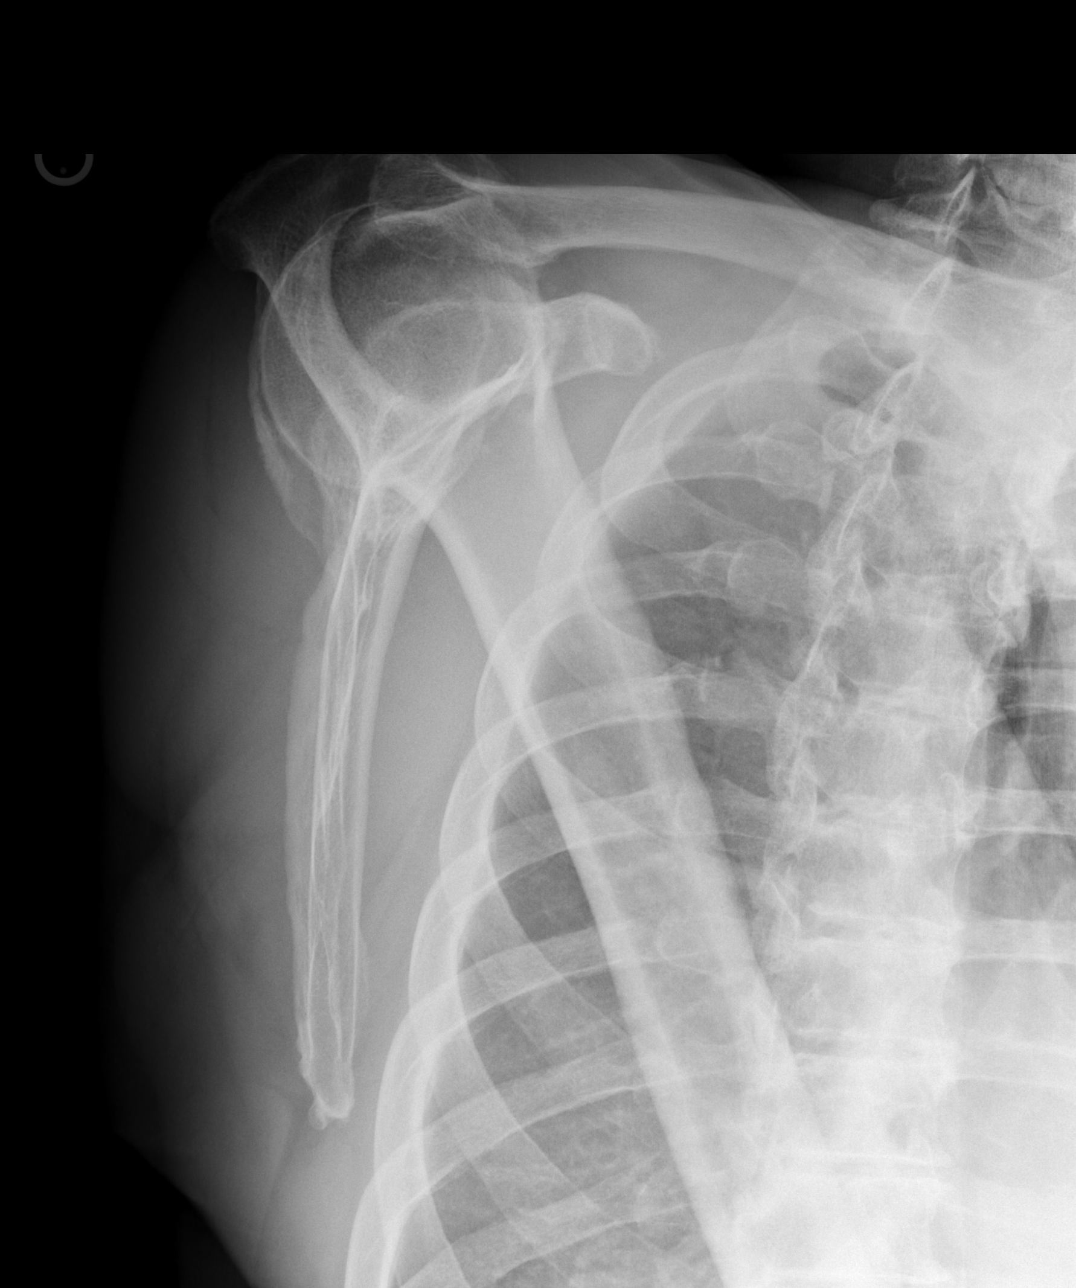

[x shoulder axillary right *]
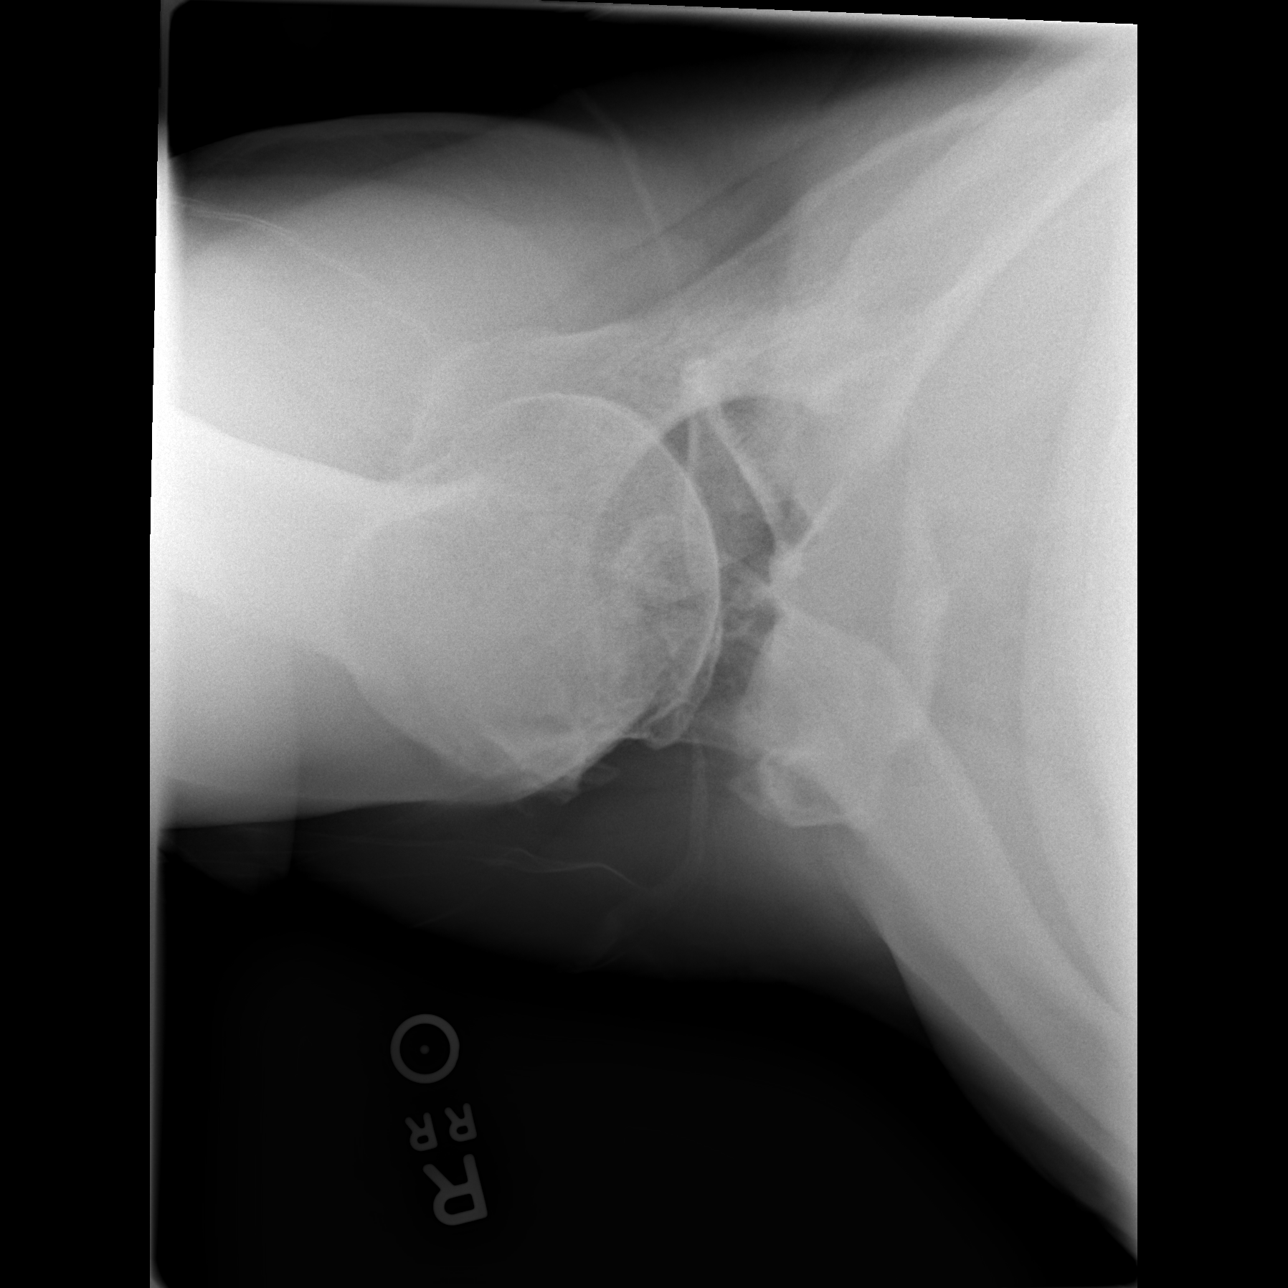

[3 of 3 positions shown; findings below may reference images not displayed]

FINDINGS: There is no evidence of fracture or dislocation. Mild degenerative
changes seen involving the acromioclavicular joint. Mild acromial
spurring is noted. Soft tissues are unremarkable.
IMPRESSION: Mild degenerative joint disease of the right acromioclavicular
joint. Mild acromial spurring. No acute abnormality seen.
